# Patient Record
Sex: Male | Born: 1950
Health system: Southern US, Community
[De-identification: ages and names within clinical notes are randomized; demographics above are authoritative.]

## PROBLEM LIST (undated history)

## (undated) DIAGNOSIS — E782 Mixed hyperlipidemia: Secondary | ICD-10-CM

## (undated) DIAGNOSIS — D369 Benign neoplasm, unspecified site: Secondary | ICD-10-CM

## (undated) DIAGNOSIS — E039 Hypothyroidism, unspecified: Secondary | ICD-10-CM

## (undated) DIAGNOSIS — I10 Essential (primary) hypertension: Secondary | ICD-10-CM

## (undated) DIAGNOSIS — E119 Type 2 diabetes mellitus without complications: Secondary | ICD-10-CM

## (undated) HISTORY — DX: Essential (primary) hypertension: I10

## (undated) HISTORY — DX: Mixed hyperlipidemia: E78.2

## (undated) HISTORY — DX: Hypothyroidism, unspecified: E03.9

## (undated) HISTORY — DX: Type 2 diabetes mellitus without complications: E11.9

## (undated) HISTORY — PX: CHOLECYSTECTOMY: SHX55

## (undated) HISTORY — DX: Benign neoplasm, unspecified site: D36.9

## (undated) HISTORY — PX: VASECTOMY: SHX75

---

## 2009-10-04 DIAGNOSIS — D369 Benign neoplasm, unspecified site: Secondary | ICD-10-CM

## 2009-10-04 HISTORY — DX: Benign neoplasm, unspecified site: D36.9

## 2009-10-04 HISTORY — PX: COLONOSCOPY: SHX174

## 2013-07-08 DIAGNOSIS — H4011X0 Primary open-angle glaucoma, stage unspecified: Secondary | ICD-10-CM | POA: Diagnosis not present

## 2013-07-18 DIAGNOSIS — H4011X0 Primary open-angle glaucoma, stage unspecified: Secondary | ICD-10-CM | POA: Diagnosis not present

## 2013-10-05 ENCOUNTER — Emergency Department (HOSPITAL_COMMUNITY): Payer: Medicare Other

## 2013-10-05 ENCOUNTER — Encounter (HOSPITAL_COMMUNITY): Payer: Self-pay | Admitting: Emergency Medicine

## 2013-10-05 ENCOUNTER — Emergency Department (HOSPITAL_COMMUNITY)
Admission: EM | Admit: 2013-10-05 | Discharge: 2013-10-06 | Disposition: A | Discharge status: Home / Self Care | Payer: Medicare Other | Attending: Emergency Medicine | Admitting: Emergency Medicine

## 2013-10-05 DIAGNOSIS — J029 Acute pharyngitis, unspecified: Secondary | ICD-10-CM | POA: Insufficient documentation

## 2013-10-05 DIAGNOSIS — Z87891 Personal history of nicotine dependence: Secondary | ICD-10-CM | POA: Insufficient documentation

## 2013-10-05 DIAGNOSIS — J40 Bronchitis, not specified as acute or chronic: Secondary | ICD-10-CM | POA: Diagnosis not present

## 2013-10-05 DIAGNOSIS — J069 Acute upper respiratory infection, unspecified: Secondary | ICD-10-CM | POA: Insufficient documentation

## 2013-10-05 DIAGNOSIS — I4891 Unspecified atrial fibrillation: Secondary | ICD-10-CM | POA: Diagnosis not present

## 2013-10-05 DIAGNOSIS — E119 Type 2 diabetes mellitus without complications: Secondary | ICD-10-CM | POA: Diagnosis not present

## 2013-10-05 DIAGNOSIS — R5381 Other malaise: Secondary | ICD-10-CM | POA: Insufficient documentation

## 2013-10-05 DIAGNOSIS — R404 Transient alteration of awareness: Secondary | ICD-10-CM | POA: Diagnosis not present

## 2013-10-05 DIAGNOSIS — R5383 Other fatigue: Secondary | ICD-10-CM

## 2013-10-05 DIAGNOSIS — R110 Nausea: Secondary | ICD-10-CM | POA: Insufficient documentation

## 2013-10-05 DIAGNOSIS — R55 Syncope and collapse: Secondary | ICD-10-CM | POA: Diagnosis not present

## 2013-10-05 LAB — CBC WITH DIFFERENTIAL/PLATELET
Basophils Absolute: 0 10*3/uL (ref 0.0–0.1)
Basophils Relative: 1 % (ref 0–1)
EOS PCT: 3 % (ref 0–5)
Eosinophils Absolute: 0.1 10*3/uL (ref 0.0–0.7)
HCT: 39.8 % (ref 39.0–52.0)
HEMOGLOBIN: 14.4 g/dL (ref 13.0–17.0)
LYMPHS ABS: 1.4 10*3/uL (ref 0.7–4.0)
LYMPHS PCT: 33 % (ref 12–46)
MCH: 31.1 pg (ref 26.0–34.0)
MCHC: 36.2 g/dL — ABNORMAL HIGH (ref 30.0–36.0)
MCV: 86 fL (ref 78.0–100.0)
Monocytes Absolute: 0.4 10*3/uL (ref 0.1–1.0)
Monocytes Relative: 10 % (ref 3–12)
Neutro Abs: 2.4 10*3/uL (ref 1.7–7.7)
Neutrophils Relative %: 54 % (ref 43–77)
Platelets: 248 10*3/uL (ref 150–400)
RBC: 4.63 MIL/uL (ref 4.22–5.81)
RDW: 12.6 % (ref 11.5–15.5)
WBC: 4.4 10*3/uL (ref 4.0–10.5)

## 2013-10-05 LAB — BASIC METABOLIC PANEL WITH GFR
BUN: 16 mg/dL (ref 6–23)
CO2: 22 meq/L (ref 19–32)
Calcium: 8.7 mg/dL (ref 8.4–10.5)
Chloride: 96 meq/L (ref 96–112)
Creatinine, Ser: 0.76 mg/dL (ref 0.50–1.35)
GFR calc Af Amer: >90 mL/min
GFR calc non Af Amer: >90 mL/min
Glucose, Bld: 237 mg/dL — ABNORMAL HIGH (ref 70–99)
Potassium: 4 meq/L (ref 3.7–5.3)
Sodium: 132 meq/L — ABNORMAL LOW (ref 137–147)

## 2013-10-05 LAB — TROPONIN I: Troponin I: <0.3 ng/mL (ref <0.30)

## 2013-10-05 MED ORDER — RIVAROXABAN 20 MG PO TABS
20.0000 mg | ORAL_TABLET | Freq: Once | ORAL | Status: AC
  Administered 2013-10-05: 20 mg via ORAL
  Filled 2013-10-05: qty 1

## 2013-10-05 MED ORDER — RIVAROXABAN 20 MG PO TABS: 20.0000 mg | ORAL_TABLET | Freq: Every day | ORAL | Status: DC

## 2013-10-05 MED ORDER — RIVAROXABAN 10 MG PO TABS
ORAL_TABLET | ORAL | Status: AC
  Filled 2013-10-05: qty 2

## 2013-10-05 MED ORDER — SODIUM CHLORIDE 0.9 % IV BOLUS (SEPSIS)
1000.0000 mL | Freq: Once | INTRAVENOUS | Status: AC
  Administered 2013-10-05: 1000 mL via INTRAVENOUS

## 2013-10-05 NOTE — ED Notes (Signed)
Around 1830 tonight, pt states he got "real hot" and dizzy. Went to fridge and drank some orange juice and reports he felt a little better.

## 2013-10-05 NOTE — ED Notes (Signed)
Ambulated pt in hallway. O2 level stayed at 98%.

## 2013-10-05 NOTE — ED Provider Notes (Signed)
CSN: [id number]     Arrival date & time 10/05/13  2057 History  This chart was scribed for Sharyon Cable, MD by Jenne Campus, ED Scribe. This patient was seen in room APA18/APA18 and the patient's care was started at 9:26 PM.   Chief Complaint  Patient presents with  . Near Syncope     Patient is a 63 y.o. male presenting with near-syncope. The history is provided by the patient. No language interpreter was used.  Near Syncope This is a new problem. The current episode started less than 1 hour ago. The problem has been resolved. Pertinent negatives include no chest pain, no abdominal pain and no shortness of breath. He has tried rest for the symptoms. The treatment provided significant relief.    HPI Comments: Micheal Young is a 63 y.o. male who presents to the Emergency Department by ambulance from home complaining of one episode of near syncope that occurred about one hour ago and lasted for a few minutes. Pt states that he has been experiencing cough, congestion, sore throat, fatigue and nausea for the past week. He states that he has similar episodes this time of year every year which resolves after a zpak. He reports that he has taken a zpak with mild improvement and had felt that his sxs had improved upon waking this morning. He states that he got up tonight from resting all day and went to his kitchen where he drank one glass of OJ. He states that he went to his living room to lay back down when he felt hot and faint. He denies any room spinning or syncopal episodes. He reports one prior episode of similar symptoms resulting in a syncopal episode after a colonoscopy several years ago. He states that the episode years agio was attributed to dehydration and due to his nausea, he has not been eating or drinking at his baseline this past week. He denies SOB, CP, palpitations or abdominal pain as associated symptoms. He denies any h/o CHF, MI, A. Fib or CVA. He has a h/o DM and is on metformin  but states that he has not taken it since being on the zpak. Last A1C was 6.7. He is also treated for HLD and thyroid disease but denies missing any of those medications.    PCP is Dr. Blanch Media   Past Medical History  Diagnosis Date  . Diabetes mellitus without complication   . Thyroid disease   . Elevated cholesterol    Past Surgical History  Procedure Laterality Date  . Cholecystectomy    . Vasectomy     History reviewed. No pertinent family history. History  Substance Use Topics  . Smoking status: Former Research scientist (life sciences)  . Smokeless tobacco: Not on file  . Alcohol Use: No    Review of Systems  Constitutional: Positive for fatigue.  HENT: Positive for congestion (improved) and sore throat (improved).   Respiratory: Positive for cough. Negative for shortness of breath.   Cardiovascular: Positive for near-syncope. Negative for chest pain and palpitations.  Gastrointestinal: Positive for nausea. Negative for vomiting, abdominal pain and diarrhea.  All other systems reviewed and are negative.   Allergies  Shellfish allergy  Home Medications  No current outpatient prescriptions on file.  Triage Vitals: BP 152/89  Temp(Src) 97.7 F (36.5 C) (Oral)  Resp 20  Ht 5\' 8"  (1.727 m)  Wt 200 lb (90.719 kg)  BMI 30.42 kg/m2  SpO2 96%  Physical Exam  Nursing note and vitals reviewed.  CONSTITUTIONAL: Well  developed/well nourished HEAD: Normocephalic/atraumatic EYES: EOMI/PERRL ENMT: Mucous membranes dry NECK: supple no meningeal signs SPINE:entire spine nontender CV: tachycardic and irregular rhythm, no murmurs/rubs/gallops noted LUNGS: Lungs are clear to auscultation bilaterally, no apparent distress ABDOMEN: soft, nontender, no rebound or guarding GU:no cva tenderness NEURO: Pt is awake/alert, moves all extremitiesx4 EXTREMITIES: pulses normal, full ROM SKIN: warm, color normal PSYCH: no abnormalities of mood noted  ED Course  Procedures   Medications  sodium chloride  0.9 % bolus 1,000 mL (0 mLs Intravenous Stopped 10/05/13 2259)  Rivaroxaban (XARELTO) tablet 20 mg (20 mg Oral Given 10/05/13 2354)    DIAGNOSTIC STUDIES: Oxygen Saturation is 96% on RA, adequate by my interpretation.    COORDINATION OF CARE: 9:32 PM-Advised pt that his sxs appear to be from A. Fib. Discussed treatment plan which includes IV fluids, CXR, CBC panel, BMP and troponin with pt at bedside and pt agreed to plan.   10:44 PM- Pt rechecked and is resting comfortably. Blood work and CXR are not suggestive of anything concerning. Pt reports that he was able to do orthostatic vitals without difficulty. Admission was discussed, but pt would rather f/u as an outpatient. Advised that I would be comfortable with discharge.  Also advised pt that he will most likely be started on an anticoagulant. Will need to consult Cardiology first. He denies any h/o bleeding ulcers or prior hematochezia or melena. Pt's questions answered to apparent satisfaction and he is agreeable with this plan.  D/w dr Kennith Center with cardiology at [facility] He recommended starting xarelto 20mg  daily.  He will place call for patient to have f/u for outpatient management of afib.  Advised pt to ensure f/u by calling within 24-48 hours  He does not require rate control.  He reports his biggest concern is cough/congestion.  He denies cp/sob.  He does not feel weak while ambulating   I suspect pt had near syncopal episode likely due to recent cough/congestion/URI symptoms.   He did not want to come into hospital for workup and he appears reasonable to followup as outpatient Discussed strict return precautions.  Discussed need to continue xarelto and not stop abruptly.  We discussed bleeding risk as well   Labs Review Labs Reviewed  CBC WITH DIFFERENTIAL - Abnormal; Notable for the following:    MCHC 36.2 (*)    All other components within normal limits  BASIC METABOLIC PANEL - Abnormal; Notable for the following:    Sodium 132  (*)    Glucose, Bld 237 (*)    All other components within normal limits  TROPONIN I   Imaging Review Dg Chest Portable 1 View  10/05/2013   CLINICAL DATA:  Cough, congestion  EXAM: PORTABLE CHEST - 1 VIEW  COMPARISON:  Prior radiograph from 10/03/2009  FINDINGS: The cardiac and mediastinal silhouettes are stable in size and contour, and remain within normal limits.  The lungs are normally inflated. Mild diffuse bronchitic changes are present. No airspace consolidation, pleural effusion, or pulmonary edema is identified. There is no pneumothorax.  No acute osseous abnormality identified.  IMPRESSION: Mild diffuse bronchitic changes, suggesting bronchiolitis. No focal infiltrates identified.   Electronically Signed   By: Jeannine Boga M.D.   On: 10/05/2013 22:00     EKG Interpretation   Date/Time:  Sunday October 05 2013 20:48:51 EDT Ventricular Rate:  91 PR Interval:    QRS Duration: 94 QT Interval:  366 QTC Calculation: 450 R Axis:   15 Text Interpretation:  Atrial fibrillation Abnormal  ECG No previous ECGs  available Confirmed by Christy Gentles  MD, Kenndra Morris ([id number]) on 10/05/2013 9:33:54 PM      MDM   Final diagnoses:  Near syncope  Atrial fibrillation  URI (upper respiratory infection)    I personally performed the services described in this documentation, which was scribed in my presence. The recorded information has been reviewed and is accurate.        Sharyon Cable, MD 10/06/13 [id number]

## 2013-10-05 NOTE — ED Notes (Signed)
Patient ambulated in hall by nurse tech. Patient's heart rate up to 99 and pulse oximetry stayed above 98%. Patient denied dizziness and had no syncopal episodes, but patient reported "I just don't feel good."

## 2013-10-05 NOTE — Discharge Instructions (Signed)
Atrial Fibrillation Atrial fibrillation is a type of irregular heart rhythm (arrhythmia). During atrial fibrillation, the upper chambers of the heart (atria) quiver continuously in a chaotic pattern. This causes an irregular and often rapid heart rate.  Atrial fibrillation is the result of the heart becoming overloaded with disorganized signals that tell it to beat. These signals are normally released one at a time by a part of the right atrium called the sinoatrial node. They then travel from the atria to the lower chambers of the heart (ventricles), causing the atria and ventricles to contract and pump blood as they pass. In atrial fibrillation, parts of the atria outside of the sinoatrial node also release these signals. This results in two problems. First, the atria receive so many signals that they do not have time to fully contract. Second, the ventricles, which can only receive one signal at a time, beat irregularly and out of rhythm with the atria.  There are three types of atrial fibrillation:   Paroxysmal Paroxysmal atrial fibrillation starts suddenly and stops on its own within a week.   Persistent Persistent atrial fibrillation lasts for more than a week. It may stop on its own or with treatment.   Permanent Permanent atrial fibrillation does not go away. Episodes of atrial fibrillation may lead to permanent atrial fibrillation.  Atrial fibrillation can prevent your heart from pumping blood normally. It increases your risk of stroke and can lead to heart failure.  CAUSES   Heart conditions, including a heart attack, heart failure, coronary artery disease, and heart valve conditions.   Inflammation of the sac that surrounds the heart (pericarditis).   Blockage of an artery in the lungs (pulmonary embolism).   Pneumonia or other infections.   Chronic lung disease.   Thyroid problems, especially if the thyroid is overactive (hyperthyroidism).   Caffeine, excessive alcohol  use, and use of some illegal drugs.   Use of some medications, including certain decongestants and diet pills.   Heart surgery.   Birth defects.  Sometimes, no cause can be found. When this happens, the atrial fibrillation is called lone atrial fibrillation. The risk of complications from atrial fibrillation increases if you have lone atrial fibrillation and you are age 65 years or older. RISK FACTORS  Heart failure.  Coronary artery disease  Diabetes mellitus.   High blood pressure (hypertension).   Obesity.   Other arrhythmias.   Increased age. SYMPTOMS   A feeling that your heart is beating rapidly or irregularly.   A feeling of discomfort or pain in your chest.   Shortness of breath.   Sudden lightheadedness or weakness.   Getting tired easily when exercising.   Urinating more often than normal (mainly when atrial fibrillation first begins).  In paroxysmal atrial fibrillation, symptoms may start and suddenly stop. DIAGNOSIS  Your caregiver may be able to detect atrial fibrillation when taking your pulse. Usually, testing is needed to diagnosis atrial fibrillation. Tests may include:   Electrocardiography. During this test, the electrical impulses of your heart are recorded while you are lying down.   Echocardiography. During echocardiography, sound waves are used to evaluate how blood flows through your heart.   Stress test. There is more than one type of stress test. If a stress test is needed, ask your caregiver about which type is best for you.   Chest X-ray exam.   Blood tests.   Computed tomography (CT).  TREATMENT   Treating any underlying conditions. For example, if you have an overactive  thyroid, treating the condition may correct atrial fibrillation.   Medication. Medications may be given to control a rapid heart rate or to prevent blood clots, heart failure, or a stroke.   Procedure to correct the rhythm of the  heart:  Electrical cardioversion. During electrical cardioversion, a controlled, low-energy shock is delivered to the heart through your skin. If you have chest pain, very low pressure blood pressure, or sudden heart failure, this procedure may need to be done as an emergency.  Catheter ablation. During this procedure, heart tissues that send the signals that cause atrial fibrillation are destroyed.  Maze or minimaze procedure. During this surgery, thin lines of heart tissue that carry the abnormal signals are destroyed. The maze procedure is an open-heart surgery. The minimaze procedure is a minimally invasive surgery. This means that small cuts are made to access the heart instead of a large opening.  Pulmonary venous isolation. During this surgery, tissue around the veins that carry blood from the lungs (pulmonary veins) is destroyed. This tissue is thought to carry the abnormal signals. HOME CARE INSTRUCTIONS   Take medications as directed by your caregiver.  Only take medications that your caregiver approves. Some medications can make atrial fibrillation worse or recur.  If blood thinners were prescribed by your caregiver, take them exactly as directed. Too much can cause bleeding. Too little and you will not have the needed protection against stroke and other problems.  Perform blood tests at home if directed by your caregiver.  Perform blood tests exactly as directed.   Quit smoking if you smoke.   Do not drink alcohol.   Do not drink caffeinated beverages such as coffee, soda, and some teas. You may drink decaffeinated coffee, soda, or tea.   Maintain a healthy weight. Do not use diet pills unless your caregiver approves. They may make heart problems worse.   Follow diet instructions as directed by your caregiver.   Exercise regularly as directed by your caregiver.   Keep all follow-up appointments. PREVENTION  The following substances can cause atrial fibrillation  to recur:   Caffeinated beverages.   Alcohol.   Certain medications, especially those used for breathing problems.   Certain herbs and herbal medications, such as those containing ephedra or ginseng.  Illegal drugs such as cocaine and amphetamines. Sometimes medications are given to prevent atrial fibrillation from recurring. Proper treatment of any underlying condition is also important in helping prevent recurrence.  SEEK MEDICAL CARE IF:  You notice a change in the rate, rhythm, or strength of your heartbeat.   You suddenly begin urinating more frequently.   You tire more easily when exerting yourself or exercising.  SEEK IMMEDIATE MEDICAL CARE IF:   You develop chest pain, abdominal pain, sweating, or weakness.  You feel sick to your stomach (nauseous).  You develop shortness of breath.  You suddenly develop swollen feet and ankles.  You feel dizzy.  You face or limbs feel numb or weak.  There is a change in your vision or speech. MAKE SURE YOU:   Understand these instructions.  Will watch your condition.  Will get help right away if you are not doing well or get worse. Document Released: 06/19/2005 Document Revised: 10/14/2012 Document Reviewed: 07/30/2012 [facility] Patient Information 2014 [place].    Anticoagulation, Generic RISKS AND COMPLICATIONS  If you have received recent epidural anesthesia, spinal anesthesia, or a spinal tap while receiving anticoagulants, you are at risk for developing a blood clot in or around the  spine. This condition could result in long-term or permanent paralysis.  Because anticoagulants thin your blood, severe bleeding may occur from any tissue or organ. Symptoms of the blood being too thin may include:  Bleeding from the nose or gums that does not stop quickly.  Unusual bruising or bruising easily.  Swelling or pain at an injection site.  A cut that does not stop bleeding within 10 minutes.  Continual  nausea for more than 1 day or vomiting blood.  Coughing up blood.  Blood in the urine which may appear as pink, red, or brown urine.  Blood in bowel movements which may appear as red, dark or black stools.  Sudden weakness or numbness of the face, arm, or leg, especially on one side of the body.  Sudden confusion.  Trouble speaking (aphasia) or understanding.  Sudden trouble seeing in one or both eyes.  Sudden trouble walking.  Dizziness.  Loss of balance or coordination.  Severe pain, such as a headache, joint pain, or back pain.  Fever.  Too little anticoagulation continues to allow the risk for blood clots.   SEEK MEDICAL CARE IF:   You develop any rashes.  You have any worsening of the condition for which you are receiving anticoagulation therapy. SEEK IMMEDIATE MEDICAL CARE IF:   Bleeding from the nose or gums does not stop quickly.  You have unusual bruising or are bruising easily.  Swelling or pain occurs at an injection site.  A cut does not stop bleeding within 10 minutes.  You have continual nausea for more than 1 day or are vomiting blood.  You are coughing up blood.  You have blood in the urine.  You have dark or black stools.  You have sudden weakness or numbness of the face, arm, or leg, especially on one side of the body.  You have sudden confusion.  You have trouble speaking (aphasia) or understanding.  You have sudden trouble seeing in one or both eyes.  You have sudden trouble walking.  You have dizziness.  You have a loss of balance or coordination.  You have severe pain, such as a headache, joint pain, or back pain.  You have a serious fall or head injury, even if you are not bleeding.  You have an oral temperature above 102 F (38.9 C), not controlled by medicine. ANY OF THESE SYMPTOMS MAY REPRESENT A SERIOUS PROBLEM THAT IS AN EMERGENCY. Do not wait to see if the symptoms will go away. Get medical help right away. Call your  local emergency services (911 in [place] DO NOT drive yourself to the hospital. MAKE SURE YOU:   Understand these instructions.  Will watch your condition.  Will get help right away if you are not doing well or get worse. Document Released: 06/19/2005 Document Revised: 03/13/2012 Document Reviewed: 01/22/2008 [facility] Patient Information 2014 [place].

## 2013-10-06 LAB — CBG MONITORING, ED: GLUCOSE-CAPILLARY: 228 mg/dL — AB (ref 70–99)

## 2013-10-27 ENCOUNTER — Other Ambulatory Visit: Payer: Self-pay | Admitting: *Deleted

## 2013-10-27 ENCOUNTER — Encounter: Payer: Self-pay | Admitting: Cardiology

## 2013-10-27 ENCOUNTER — Ambulatory Visit (INDEPENDENT_AMBULATORY_CARE_PROVIDER_SITE_OTHER): Payer: Medicare Other | Admitting: Cardiology

## 2013-10-27 VITALS — BP 151/89 | HR 62 | Ht 68.0 in | Wt 196.0 lb

## 2013-10-27 DIAGNOSIS — I4891 Unspecified atrial fibrillation: Secondary | ICD-10-CM | POA: Diagnosis not present

## 2013-10-27 DIAGNOSIS — R55 Syncope and collapse: Secondary | ICD-10-CM | POA: Diagnosis not present

## 2013-10-27 DIAGNOSIS — E782 Mixed hyperlipidemia: Secondary | ICD-10-CM | POA: Diagnosis not present

## 2013-10-27 DIAGNOSIS — E119 Type 2 diabetes mellitus without complications: Secondary | ICD-10-CM | POA: Diagnosis not present

## 2013-10-27 NOTE — Patient Instructions (Signed)
Your physician recommends that you schedule a follow-up appointment in: 1 month. Your physician recommends that you continue on your current medications as directed. Please refer to the Current Medication list given to you today. Your physician has recommended that you wear an event monitor. Event monitors are medical devices that record the heart's electrical activity. Doctors most often [place] these monitors to diagnose arrhythmias. Arrhythmias are problems with the speed or rhythm of the heartbeat. The monitor is a small, portable device. You can wear one while you do your normal daily activities. This is usually used to diagnose what is causing palpitations/syncope (passing out). [facility] WILL CONTACT YOU DIRECTLY ABOUT GETTING THIS MONITOR. Your physician has requested that you have an echocardiogram. Echocardiography is a painless test that uses sound waves to create images of your heart. It provides your doctor with information about the size and shape of your heart and how well your heart's chambers and valves are working. This procedure takes approximately one hour. There are no restrictions for this procedure.

## 2013-10-27 NOTE — Assessment & Plan Note (Signed)
Recently diagnosed in early April, duration uncertain and spontaneously converted to sinus rhythm at some point since then. CHADSVASC score is 3. It is not clear whether he has had PAF, or if this was an episode of atrial fibrillation related to upper respiratory tract infection. ECG today shows normal sinus rhythm. For now our plan is to continue current regimen including Toprol-XL and Xarelto. We will have him wear a monitor for 7 days and obtain echocardiogram. Followup arranged to review the results and discuss whether to continue anticoagulants long-term for the possibility of PAF, versus go back to aspirin daily and then consider anticoagulation with his next confirmed event of atrial fibrillation.

## 2013-10-27 NOTE — Assessment & Plan Note (Signed)
Followed by Dr. Sherrie Sport.

## 2013-10-27 NOTE — Assessment & Plan Note (Signed)
On Lipitor, followed by Dr. Sherrie Sport.

## 2013-10-27 NOTE — Assessment & Plan Note (Signed)
Possibly related to relative dehydration with URI. Continue observation for now. We are obtaining an echocardiogram as well to assess cardiac structure and function.

## 2013-10-27 NOTE — Progress Notes (Signed)
Clinical Summary Micheal Young is a 63 y.o.male referred for cardiology consultation by Dr. Sherrie Sport after recent ER visit at [facility]. The patient presented earlier in the month with symptoms of dizziness and weakness. This had been preceded by a week of upper respiratory symptoms, possible URI treated with azithromycin. He had not been eating or drinking well. When he was in the ER, he was hydrated and felt better, although diagnosed with rate-controlled atrial fibrillation of uncertain duration. He was placed on Xarelto at that time and has had followup with Dr. Sherrie Sport since then.  Lab work from this April reviewed finding hemoglobin 14.4, platelets 248, potassium 4.0, BUN 16, creatinine 0.7, troponin I less than 0.30. ECG from April 5 showed atrial fibrillation with controlled ventricular response, nonspecific T-wave changes. Chest x-ray from April 5 described mild diffuse bronchitic changes, no infiltrates.  CHADSVASC score is 3 is this point. He is not aware of any symptoms of palpitations, no prior diagnosis of atrial fibrillation. Today we discussed overall stroke risk versus bleeding risk with anticoagulants versus aspirin. Annual stroke risk would be approximately 3-4% on aspirin with a bleeding risk of 1% per year versus stroke risk of 1-2% on Xarelto with a major bleeding risk of 2-3% per year.  ECG today shows normal sinus rhythm.   Allergies  Allergen Reactions  . Shellfish Allergy Swelling    Swelling of the eyes    Current Outpatient Prescriptions  Medication Sig Dispense Refill  . atorvastatin (LIPITOR) 40 MG tablet Take 40 mg by mouth at bedtime.      Marland Kitchen LUMIGAN 0.01 % SOLN Place 1 drop into both eyes at bedtime.      . metFORMIN (GLUCOPHAGE-XR) 500 MG 24 hr tablet Take 500 mg by mouth daily.      . metoprolol succinate (TOPROL-XL) 25 MG 24 hr tablet Take 1 tablet by mouth daily.      . Rivaroxaban (XARELTO) 20 MG TABS tablet Take 1 tablet (20 mg total) by mouth daily  with supper.  30 tablet  0  . SIMBRINZA 1-0.2 % SUSP Place 1 drop into the left eye 2 (two) times daily.      Marland Kitchen SYNTHROID 150 MCG tablet Take 150 mcg by mouth every morning.      . timolol (TIMOPTIC) 0.5 % ophthalmic solution Place 1 drop into both eyes every morning.       No current facility-administered medications for this visit.    Past Medical History  Diagnosis Date  . Type 2 diabetes mellitus   . Hypothyroidism   . Mixed hyperlipidemia   . Essential hypertension, benign     Past Surgical History  Procedure Laterality Date  . Cholecystectomy    . Vasectomy      History reviewed. No pertinent family history.  Social History Micheal Young reports that he quit smoking about 27 years ago. His smoking use included Cigarettes. He started smoking about 45 years ago. He has a 17 pack-year smoking history. He has never used smokeless tobacco. Micheal Young reports that he does not drink alcohol.  Review of Systems No exertional chest pain, NYHA class II dyspnea. No orthopnea or PND. No unusual bleeding problems. Otherwise negative.  Physical Examination Filed Vitals:   10/27/13 0830  BP: 151/89  Pulse: 62   Filed Weights   10/27/13 0830  Weight: 196 lb (88.905 kg)   Patient appears comfortable at rest. HEENT: Conjunctiva and lids normal, oropharynx clear. Neck: Supple, no elevated JVP or carotid  bruits, no thyromegaly. Lungs: Clear to auscultation, nonlabored breathing at rest. Cardiac: Regular rate and rhythm, no S3 or significant systolic murmur, no pericardial rub. Abdomen: Soft, nontender, bowel sounds present. Extremities: No pitting edema, distal pulses 2+. Skin: Warm and dry. Musculoskeletal: No kyphosis. Neuropsychiatric: Alert and oriented x3, affect grossly appropriate.   Problem List and Plan   Atrial fibrillation Recently diagnosed in early April, duration uncertain and spontaneously converted to sinus rhythm at some point since then. CHADSVASC score is  3. It is not clear whether he has had PAF, or if this was an episode of atrial fibrillation related to upper respiratory tract infection. ECG today shows normal sinus rhythm. For now our plan is to continue current regimen including Toprol-XL and Xarelto. We will have him wear a monitor for 7 days and obtain echocardiogram. Followup arranged to review the results and discuss whether to continue anticoagulants long-term for the possibility of PAF, versus go back to aspirin daily and then consider anticoagulation with his next confirmed event of atrial fibrillation.  Mixed hyperlipidemia On Lipitor, followed by Dr. Sherrie Sport.  Type 2 diabetes mellitus Followed by Dr. Sherrie Sport.  Near syncope Possibly related to relative dehydration with URI. Continue observation for now. We are obtaining an echocardiogram as well to assess cardiac structure and function.    Satira Sark, M.D., F.A.C.C.

## 2013-11-06 ENCOUNTER — Other Ambulatory Visit: Payer: Self-pay

## 2013-11-06 ENCOUNTER — Other Ambulatory Visit (INDEPENDENT_AMBULATORY_CARE_PROVIDER_SITE_OTHER): Payer: Medicare Other

## 2013-11-06 DIAGNOSIS — I4891 Unspecified atrial fibrillation: Secondary | ICD-10-CM

## 2013-11-06 DIAGNOSIS — I059 Rheumatic mitral valve disease, unspecified: Secondary | ICD-10-CM | POA: Diagnosis not present

## 2013-11-07 DIAGNOSIS — I4891 Unspecified atrial fibrillation: Secondary | ICD-10-CM

## 2013-11-10 ENCOUNTER — Telehealth: Payer: Self-pay | Admitting: *Deleted

## 2013-11-10 NOTE — Telephone Encounter (Signed)
Notes Recorded by Laurine Blazer, LPN on 4/78/2956 at 2:13 PM Patient notified and verbalized understanding. Already has follow up scheduled with Dr. Domenic Polite for 12/01/2013.

## 2013-11-10 NOTE — Telephone Encounter (Signed)
Message copied by Laurine Blazer on Mon Nov 10, 2013  1:52 PM ------      Message from: Satira Sark      Created: Fri Nov 07, 2013  7:46 AM       Reviewed report. Moderate LVH in the setting of hypertension with normal systolic function. No major valvular abnormalities of clinical concern. Will discuss further office followup. ------

## 2013-11-20 DIAGNOSIS — H4011X0 Primary open-angle glaucoma, stage unspecified: Secondary | ICD-10-CM | POA: Diagnosis not present

## 2013-12-01 ENCOUNTER — Encounter: Payer: Self-pay | Admitting: Cardiology

## 2013-12-01 ENCOUNTER — Ambulatory Visit (INDEPENDENT_AMBULATORY_CARE_PROVIDER_SITE_OTHER): Payer: Medicare Other | Admitting: Cardiology

## 2013-12-01 VITALS — BP 156/79 | HR 73 | Ht 68.0 in | Wt 200.8 lb

## 2013-12-01 DIAGNOSIS — I4891 Unspecified atrial fibrillation: Secondary | ICD-10-CM | POA: Diagnosis not present

## 2013-12-01 DIAGNOSIS — E782 Mixed hyperlipidemia: Secondary | ICD-10-CM | POA: Diagnosis not present

## 2013-12-01 MED ORDER — ASPIRIN EC 81 MG PO TBEC: 81.0000 mg | DELAYED_RELEASE_TABLET | Freq: Every day | ORAL | Status: AC

## 2013-12-01 NOTE — Assessment & Plan Note (Signed)
Single documented episode in the setting of a URI, spontaneously converted to sinus rhythm. In light of relatively low stroke risk, no documented asymptomatic PAF on subsequent monitoring, and reassuring echocardiographic results, plan will be to discontinue Xarelto for now (patient's preference) and go on aspirin daily 81 mg. If he has any further atrial arrhythmias however, anticoagulation would be recommended longer-term.

## 2013-12-01 NOTE — Patient Instructions (Signed)
Your physician recommends that you schedule a follow-up appointment in: 6 months. You will receive a reminder letter in the mail in about 4 months reminding you to call and schedule your appointment. If you don't receive this letter, please contact our office. Your physician has recommended you make the following change in your medication:  Start aspirin 81 mg daily. Continue all other medications the same.

## 2013-12-01 NOTE — Assessment & Plan Note (Signed)
He is concerned about possible Lipitor side effects. He does plan to address this with Dr. Sherrie Sport. I recommended that he consider a drug holiday to see if symptoms resolve.

## 2013-12-01 NOTE — Progress Notes (Signed)
Clinical Summary Micheal Young is a 63 y.o.male last seen in April. He was seen in that time in followup of documented atrial fibrillation of uncertain duration noted in the setting of URI. He had spontaneously converted to sinus rhythm by the time that we saw him.  Echocardiogram from May showed moderate LVH with LVEF 72-53%, grade 1 diastolic dysfunction, mildly sclerotic aortic valve, mild mitral regurgitation, mildly dilated RV. Followup cardiac monitor did not demonstrate any atrial fibrillation.  CHADSVASC score is 3. He is not aware of any symptoms of palpitations, no prior diagnosis of atrial fibrillation. We discussed overall stroke risk versus bleeding risk with anticoagulants versus aspirin. Annual stroke risk would be approximately 3-4% on aspirin with a bleeding risk of 1% per year versus stroke risk of 1-2% on Xarelto with a major bleeding risk of 2-3% per year.  He is here with his wife today. Still not experiencing any palpitations, chest pain, or shortness of breath. Rhythm remains regular. He would like to come off anticoagulation unless he has any further documented atrial fibrillation, not unreasonable with relatively low stroke risk. I have recommended that he take an aspirin daily.  He reports feeling tired, also having joint/shoulder discomfort since being on atorvastatin. He plans to address this with Dr. Sherrie Sport.   Allergies  Allergen Reactions  . Shellfish Allergy Swelling    Swelling of the eyes    Current Outpatient Prescriptions  Medication Sig Dispense Refill  . atorvastatin (LIPITOR) 40 MG tablet Take 40 mg by mouth at bedtime.      Marland Kitchen LUMIGAN 0.01 % SOLN Place 1 drop into both eyes at bedtime.      . metFORMIN (GLUCOPHAGE-XR) 500 MG 24 hr tablet Take 500 mg by mouth daily.      . metoprolol succinate (TOPROL-XL) 25 MG 24 hr tablet Take 1 tablet by mouth daily.      . Rivaroxaban (XARELTO) 20 MG TABS tablet Take 1 tablet (20 mg total) by mouth daily with  supper.  30 tablet  0  . SIMBRINZA 1-0.2 % SUSP Place 1 drop into the left eye 2 (two) times daily.      Marland Kitchen SYNTHROID 150 MCG tablet Take 150 mcg by mouth every morning.      . timolol (TIMOPTIC) 0.5 % ophthalmic solution Place 1 drop into both eyes every morning.       No current facility-administered medications for this visit.    Past Medical History  Diagnosis Date  . Type 2 diabetes mellitus   . Hypothyroidism   . Mixed hyperlipidemia   . Essential hypertension, benign     Social History Mr. Salls reports that he quit smoking about 27 years ago. His smoking use included Cigarettes. He started smoking about 45 years ago. He has a 17 pack-year smoking history. He has never used smokeless tobacco. Mr. Moring reports that he does not drink alcohol.  Review of Systems Negative except as outlined.  Physical Examination Filed Vitals:   12/01/13 1535  BP: 156/79  Pulse: 73   Filed Weights   12/01/13 1535  Weight: 200 lb 12.8 oz (91.082 kg)    Patient appears comfortable at rest.  HEENT: Conjunctiva and lids normal, oropharynx clear.  Neck: Supple, no elevated JVP or carotid bruits, no thyromegaly.  Lungs: Clear to auscultation, nonlabored breathing at rest.  Cardiac: Regular rate and rhythm, no S3 or significant systolic murmur, no pericardial rub.  Abdomen: Soft, nontender, bowel sounds present.  Extremities: No pitting edema,  distal pulses 2+.    Problem List and Plan   Atrial fibrillation Single documented episode in the setting of a URI, spontaneously converted to sinus rhythm. In light of relatively low stroke risk, no documented asymptomatic PAF on subsequent monitoring, and reassuring echocardiographic results, plan will be to discontinue Xarelto for now (patient's preference) and go on aspirin daily 81 mg. If he has any further atrial arrhythmias however, anticoagulation would be recommended longer-term.  Mixed hyperlipidemia He is concerned about possible  Lipitor side effects. He does plan to address this with Dr. Sherrie Sport. I recommended that he consider a drug holiday to see if symptoms resolve.    Satira Sark, M.D., F.A.C.C.

## 2013-12-04 DIAGNOSIS — E038 Other specified hypothyroidism: Secondary | ICD-10-CM | POA: Diagnosis not present

## 2013-12-04 DIAGNOSIS — IMO0001 Reserved for inherently not codable concepts without codable children: Secondary | ICD-10-CM | POA: Diagnosis not present

## 2013-12-10 DIAGNOSIS — Z125 Encounter for screening for malignant neoplasm of prostate: Secondary | ICD-10-CM | POA: Diagnosis not present

## 2013-12-10 DIAGNOSIS — IMO0001 Reserved for inherently not codable concepts without codable children: Secondary | ICD-10-CM | POA: Diagnosis not present

## 2013-12-10 DIAGNOSIS — E038 Other specified hypothyroidism: Secondary | ICD-10-CM | POA: Diagnosis not present

## 2013-12-10 DIAGNOSIS — I10 Essential (primary) hypertension: Secondary | ICD-10-CM | POA: Diagnosis not present

## 2013-12-10 DIAGNOSIS — E782 Mixed hyperlipidemia: Secondary | ICD-10-CM | POA: Diagnosis not present

## 2014-01-05 DIAGNOSIS — G5600 Carpal tunnel syndrome, unspecified upper limb: Secondary | ICD-10-CM | POA: Diagnosis not present

## 2014-01-14 ENCOUNTER — Ambulatory Visit (INDEPENDENT_AMBULATORY_CARE_PROVIDER_SITE_OTHER): Payer: Medicare Other

## 2014-01-14 ENCOUNTER — Telehealth: Payer: Self-pay | Admitting: *Deleted

## 2014-01-14 DIAGNOSIS — R209 Unspecified disturbances of skin sensation: Secondary | ICD-10-CM | POA: Diagnosis not present

## 2014-01-14 NOTE — Procedures (Signed)
     HISTORY:  Jarick Harkins is a 63 year old patient with a history of numbness involving mainly the left hand. The patient is being evaluated for possible neuropathy. The patient does have a history of diabetes and hypothyroidism.  NERVE CONDUCTION STUDIES:  Nerve conduction studies were performed on both upper extremities. The distal motor latencies and motor amplitudes for the median and ulnar nerves were within normal limits. The distal motor latencies for the radial nerves were slightly prolonged bilaterally. The F wave latencies and nerve conduction velocities for the median and ulnar nerves were also normal. The sensory latencies for the median, radial, and ulnar nerves were normal.   EMG STUDIES:  EMG evaluation was not performed.  IMPRESSION:  Nerve conduction studies done on both upper extremities were essentially normal with exception that the distal motor latencies for the radial nerves were slightly prolonged bilaterally, suggesting mild distal dysfunction of the radial nerves between the elbow and wrist, with sensory sparing. This could represent mild bilateral posterior interosseous neuropathies. Clinical correlation is required. No other significant abnormalities were seen. EMG evaluation may improve diagnostic specificity.  Jill Alexanders MD 01/14/2014 1:59 PM  [facility] [address] [place], [place] [address]  Phone [phone] Fax [phone]

## 2014-01-22 ENCOUNTER — Ambulatory Visit: Payer: Medicare Other | Admitting: Gastroenterology

## 2014-01-22 ENCOUNTER — Telehealth: Payer: Self-pay | Admitting: Internal Medicine

## 2014-01-22 NOTE — Telephone Encounter (Signed)
-----   Message -----  From: Theadora Rama  Sent: 01/22/2014 8:10 AM  To: Mickel Fuchs, MD   I called patient yesterday to offer him OV for today or next Tuesday, but he said it was too short of notice and he is going out of town Sunday for 2 weeks. I have him scheduled for 8/21 with RMR. Patient does not want to see extender.

## 2014-02-20 ENCOUNTER — Ambulatory Visit (INDEPENDENT_AMBULATORY_CARE_PROVIDER_SITE_OTHER): Payer: Medicare Other | Admitting: Internal Medicine

## 2014-02-20 ENCOUNTER — Other Ambulatory Visit: Payer: Self-pay | Admitting: Internal Medicine

## 2014-02-20 ENCOUNTER — Encounter (INDEPENDENT_AMBULATORY_CARE_PROVIDER_SITE_OTHER): Payer: Self-pay

## 2014-02-20 ENCOUNTER — Encounter: Payer: Self-pay | Admitting: Internal Medicine

## 2014-02-20 VITALS — BP 157/86 | HR 71 | Temp 97.8°F | Ht 68.0 in | Wt 199.8 lb

## 2014-02-20 DIAGNOSIS — Z800 Family history of malignant neoplasm of digestive organs: Secondary | ICD-10-CM

## 2014-02-20 DIAGNOSIS — Z86010 Personal history of colonic polyps: Secondary | ICD-10-CM

## 2014-02-20 NOTE — Patient Instructions (Signed)
Schedule colonoscopy - positive family history of colon cancer and personal history of colon polyps  Review adequate hydration during preparation period  Split Movie prep  Take 1/2 of your metformin the day before colonoscopy and none day of procedure  Further recommendations to follow

## 2014-02-20 NOTE — Progress Notes (Signed)
Primary Care Physician:  Neale Burly, MD Primary Gastroenterologist:  Dr. Gala Romney  Pre-Procedure History & Physical: HPI:  Micheal Young is a 63 y.o. male here for for consideration of a followup colonoscopy. Patient's brother, whom I caree for her over the years, succumbed to colorectal cancer last year. He was diagnosed in his early 3s. Mr. Ryser  last colonoscopy up in [facility]  April 2011. At that time a small rectal adenoma was found. Currently, not having any lower GI tract symptoms or upper GI tract symptoms for that matter.  Patient was concerned about getting "dehydrated" in preparing for his last 2 colonoscopies. Apparently, just to took the prep and not much in the way a maintenance fluids during the prep.  Past Medical History  Diagnosis Date  . Type 2 diabetes mellitus   . Hypothyroidism   . Mixed hyperlipidemia   . Essential hypertension, benign   . Tubular adenoma 10/04/09    Dr. Rowe Pavy    Past Surgical History  Procedure Laterality Date  . Cholecystectomy    . Vasectomy    . Colonoscopy  10/04/09    Dr.Anwara sessile polyp was found in the rectum, no other abnormalities. bx= tubular adenoma    Prior to Admission medications   Medication Sig Start Date End Date Taking? Authorizing Provider  aspirin EC 81 MG tablet Take 1 tablet (81 mg total) by mouth daily. 12/01/13  Yes Satira Sark, MD  atorvastatin (LIPITOR) 40 MG tablet Take 40 mg by mouth at bedtime. 09/29/13  Yes Historical Provider, MD  LUMIGAN 0.01 % SOLN Place 1 drop into both eyes at bedtime. 09/16/13  Yes Historical Provider, MD  metFORMIN (GLUCOPHAGE-XR) 500 MG 24 hr tablet Take 500 mg by mouth daily. 07/09/13  Yes Historical Provider, MD  metoprolol succinate (TOPROL-XL) 25 MG 24 hr tablet Take 1 tablet by mouth daily. 10/06/13  Yes Historical Provider, MD  SIMBRINZA 1-0.2 % SUSP Place 1 drop into the left eye 2 (two) times daily. 09/09/13  Yes Historical Provider, MD  SYNTHROID 150 MCG tablet Take 150 mcg by  mouth every morning. 09/09/13  Yes Historical Provider, MD  timolol (TIMOPTIC) 0.5 % ophthalmic solution Place 1 drop into both eyes every morning. 09/09/13  Yes Historical Provider, MD    Allergies as of 02/20/2014 - Review Complete 12/01/2013  Allergen Reaction Noted  . Shellfish allergy Swelling 10/05/2013    No family history on file.  History   Social History  . Marital Status: Married    Spouse Name: N/A    Number of Children: N/A  . Years of Education: N/A   Occupational History  . Not on file.   Social History Main Topics  . Smoking status: Former Smoker -- 1.00 packs/day for 17 years    Types: Cigarettes    Start date: 07/10/1968    Quit date: 12/19/1985  . Smokeless tobacco: Never Used     Comment: smoked pipe also  . Alcohol Use: No  . Drug Use: No  . Sexual Activity: Not on file   Other Topics Concern  . Not on file   Social History Narrative  . No narrative on file    Review of Systems: See HPI, otherwise negative ROS  Physical Exam: BP 157/86  Pulse 71  Temp(Src) 97.8 F (36.6 C) (Oral)  Ht 5\' 8"  (1.727 m)  Wt 199 lb 12.8 oz (90.629 kg)  BMI 30.39 kg/m2 General:   Alert,  Well-developed, well-nourished, pleasant and cooperative in NAD  Skin:  Intact without significant lesions or rashes. Eyes:  Sclera clear, no icterus.   Conjunctiva pink. Ears:  Normal auditory acuity. Nose:  No deformity, discharge,  or lesions. Mouth:  No deformity or lesions. Neck:  Supple; no masses or thyromegaly. No significant cervical adenopathy. Lungs:  Clear throughout to auscultation.   No wheezes, crackles, or rhonchi. No acute distress. Heart:  Regular rate and rhythm; no murmurs, clicks, rubs,  or gallops. Abdomen: Non-distended, normal bowel sounds.  Soft and nontender without appreciable mass or hepatosplenomegaly.  Pulses:  Normal pulses noted. Extremities:  Without clubbing or edema.  Impression:   Very pleasant 63 year old gentleman personal history of  colonic adenoma and positive family history of colon cancer in first-degree relative at a relatively young age. Currently, no GI symptoms. It's been 3 1/2 years since he last had a colonoscopy. He desires a colonoscopy at this time. I agree with that approach.  Recommendations:   Reviewed the importance of maintenance fluids in addition to the colonoscopy fluid preparation during the preparation period.  The risks, benefits, limitations, alternatives and imponderables have been reviewed with the patient. Questions have been answered. All parties are agreeable.   Schedule colonoscopy - positive family history of colon cancer and personal history of colon polyps  Review adequate hydration during preparation period  Split Movie prep  Take 1/2 of your metformin the day before colonoscopy and none day of procedure  Further recommendations to follow      Notice: This dictation was prepared with Dragon dictation along with smaller phrase technology. Any transcriptional errors that result from this process are unintentional and may not be corrected upon review.

## 2014-02-22 ENCOUNTER — Encounter: Payer: Self-pay | Admitting: Internal Medicine

## 2014-02-23 ENCOUNTER — Encounter (HOSPITAL_COMMUNITY): Payer: Self-pay | Admitting: Pharmacy Technician

## 2014-03-03 DIAGNOSIS — H4011X0 Primary open-angle glaucoma, stage unspecified: Secondary | ICD-10-CM | POA: Diagnosis not present

## 2014-03-10 ENCOUNTER — Encounter (HOSPITAL_COMMUNITY): Payer: Self-pay | Admitting: *Deleted

## 2014-03-10 ENCOUNTER — Encounter (HOSPITAL_COMMUNITY)
Admission: RE | Disposition: A | Discharge status: Home / Self Care | Payer: Self-pay | Source: Ambulatory Visit | Attending: Internal Medicine

## 2014-03-10 ENCOUNTER — Ambulatory Visit (HOSPITAL_COMMUNITY)
Admission: RE | Admit: 2014-03-10 | Discharge: 2014-03-10 | Disposition: A | Discharge status: Home / Self Care | Payer: Medicare Other | Source: Ambulatory Visit | Attending: Internal Medicine | Admitting: Internal Medicine

## 2014-03-10 DIAGNOSIS — Z860100 Personal history of colon polyps, unspecified: Secondary | ICD-10-CM | POA: Diagnosis not present

## 2014-03-10 DIAGNOSIS — E039 Hypothyroidism, unspecified: Secondary | ICD-10-CM | POA: Diagnosis not present

## 2014-03-10 DIAGNOSIS — E119 Type 2 diabetes mellitus without complications: Secondary | ICD-10-CM | POA: Insufficient documentation

## 2014-03-10 DIAGNOSIS — D128 Benign neoplasm of rectum: Secondary | ICD-10-CM | POA: Diagnosis not present

## 2014-03-10 DIAGNOSIS — D126 Benign neoplasm of colon, unspecified: Secondary | ICD-10-CM | POA: Diagnosis not present

## 2014-03-10 DIAGNOSIS — D129 Benign neoplasm of anus and anal canal: Secondary | ICD-10-CM | POA: Diagnosis not present

## 2014-03-10 DIAGNOSIS — Z87891 Personal history of nicotine dependence: Secondary | ICD-10-CM | POA: Insufficient documentation

## 2014-03-10 DIAGNOSIS — Z1211 Encounter for screening for malignant neoplasm of colon: Secondary | ICD-10-CM | POA: Insufficient documentation

## 2014-03-10 DIAGNOSIS — E782 Mixed hyperlipidemia: Secondary | ICD-10-CM | POA: Insufficient documentation

## 2014-03-10 DIAGNOSIS — Z800 Family history of malignant neoplasm of digestive organs: Secondary | ICD-10-CM | POA: Diagnosis not present

## 2014-03-10 DIAGNOSIS — I10 Essential (primary) hypertension: Secondary | ICD-10-CM | POA: Diagnosis not present

## 2014-03-10 HISTORY — PX: COLONOSCOPY: SHX5424

## 2014-03-10 LAB — GLUCOSE, CAPILLARY: Glucose-Capillary: 164 mg/dL — ABNORMAL HIGH (ref 70–99)

## 2014-03-10 SURGERY — COLONOSCOPY
Anesthesia: Moderate Sedation

## 2014-03-10 MED ORDER — MIDAZOLAM HCL 5 MG/5ML IJ SOLN
INTRAMUSCULAR | Status: DC | PRN
  Administered 2014-03-10: 1 mg via INTRAVENOUS
  Administered 2014-03-10: 2 mg via INTRAVENOUS
  Administered 2014-03-10: 1 mg via INTRAVENOUS
  Administered 2014-03-10: 2 mg via INTRAVENOUS
  Administered 2014-03-10 (×2): 1 mg via INTRAVENOUS

## 2014-03-10 MED ORDER — ONDANSETRON HCL 4 MG/2ML IJ SOLN
INTRAMUSCULAR | Status: DC
Start: 2014-03-10 — End: 2014-03-10
  Filled 2014-03-10: qty 2

## 2014-03-10 MED ORDER — MEPERIDINE HCL 100 MG/ML IJ SOLN
INTRAMUSCULAR | Status: AC
  Filled 2014-03-10: qty 2

## 2014-03-10 MED ORDER — MIDAZOLAM HCL 5 MG/5ML IJ SOLN
INTRAMUSCULAR | Status: DC
Start: 2014-03-10 — End: 2014-03-10
  Filled 2014-03-10: qty 10

## 2014-03-10 MED ORDER — MEPERIDINE HCL 100 MG/ML IJ SOLN
INTRAMUSCULAR | Status: DC | PRN
  Administered 2014-03-10 (×2): 25 mg via INTRAVENOUS
  Administered 2014-03-10: 50 mg via INTRAVENOUS

## 2014-03-10 MED ORDER — SODIUM CHLORIDE 0.9 % IV SOLN
INTRAVENOUS | Status: DC
  Administered 2014-03-10: 12:00:00 via INTRAVENOUS

## 2014-03-10 MED ORDER — SIMETHICONE 40 MG/0.6ML PO SUSP
ORAL | Status: DC | PRN
  Administered 2014-03-10: 12:00:00

## 2014-03-10 MED ORDER — ONDANSETRON HCL 4 MG/2ML IJ SOLN
INTRAMUSCULAR | Status: DC | PRN
  Administered 2014-03-10: 4 mg via INTRAVENOUS

## 2014-03-10 NOTE — Interval H&P Note (Signed)
History and Physical Interval Note:  03/10/2014 12:04 PM  Micheal Young  has presented today for surgery, with the diagnosis of HISTORY OF COLON POLYPS  The various methods of treatment have been discussed with the patient and family. After consideration of risks, benefits and other options for treatment, the patient has consented to  Procedure(s) with comments: COLONOSCOPY (N/A) - 12:45-rescheduled 9/8 to [place] notified pt as a surgical intervention .  The patient's history has been reviewed, patient examined, no change in status, stable for surgery.  I have reviewed the patient's chart and labs.  Questions were answered to the patient's satisfaction.     No change. Surveillance colonoscopy per plan.The risks, benefits, limitations, alternatives and imponderables have been reviewed with the patient. Questions have been answered. All parties are agreeable.   Manus Rudd

## 2014-03-10 NOTE — Op Note (Signed)
[facility] [address] [place], [address]   COLONOSCOPY PROCEDURE REPORT  PATIENT: Micheal Young, Micheal Young  MR#:         [id number] BIRTHDATE: 1950-12-17 , 89  yrs. old GENDER: Male ENDOSCOPIST: R.  Garfield Cornea, MD FACP FACG REFERRED BY:  Nadeen Landau, M.D. PROCEDURE DATE:  03/10/2014 PROCEDURE:     Ileocolonoscopy with snare polypectomy and biopsy  INDICATIONS: Surveillance examination; personal history of colonic polyp and positive family history of colorectal cancer  INFORMED CONSENT:  The risks, benefits, alternatives and imponderables including but not limited to bleeding, perforation as well as the possibility of a missed lesion have been reviewed.  The potential for biopsy, lesion removal, etc. have also been discussed.  Questions have been answered.  All parties agreeable. Please see the history and physical in the medical record for more information.  MEDICATIONS: Versed 8 mg IV and Demerol 100 mg IV in divided doses. Zofran 4 mg IV  DESCRIPTION OF PROCEDURE:  After a digital rectal exam was performed, the EC-3890Li ([id number]) and EC-3890Li ([id number]) colonoscope was advanced from the anus through the rectum and colon to the area of the cecum, ileocecal valve and appendiceal orifice. The cecum was deeply intubated.  These structures were well-seen and photographed for the record.  From the level of the cecum and ileocecal valve, the scope was slowly and cautiously withdrawn. The mucosal surfaces were carefully surveyed utilizing scope tip deflection to facilitate fold flattening as needed.  The scope was pulled down into the rectum where a thorough examination including retroflexion was performed.    FINDINGS:  Adequate preparation. Normal rectum.  At the rectosigmoid junction, there were (2) 5 mm polyps; there was (1) diminutive polyp in the distal transverse colon. The remainder of the colonic mucosa appeared normal. The distal 5 cm of terminal ileum  mucosa also appeared normal.  THERAPEUTIC / DIAGNOSTIC MANEUVERS PERFORMED:  The rectosigmoid polyps were hot snare removed. The diminutive transverse colon polyp cold biopsied/removed.  COMPLICATIONS: none  CECAL WITHDRAWAL TIME:  15 minutes  IMPRESSION:  Colonic polyps-removed as described above  RECOMMENDATIONS: Followup on pathology.   _______________________________ eSigned:  R. Garfield Cornea, MD FACP [facility] 03/10/2014 12:57 PM   CC:

## 2014-03-10 NOTE — H&P (View-Only) (Signed)
Primary Care Physician:  Neale Burly, MD Primary Gastroenterologist:  Dr. Gala Romney  Pre-Procedure History & Physical: HPI:  Micheal Young is a 63 y.o. male here for for consideration of a followup colonoscopy. Patient's brother, whom I caree for her over the years, succumbed to colorectal cancer last year. He was diagnosed in his early 83s. Micheal Young  last colonoscopy up in [facility]  April 2011. At that time a small rectal adenoma was found. Currently, not having any lower GI tract symptoms or upper GI tract symptoms for that matter.  Patient was concerned about getting "dehydrated" in preparing for his last 2 colonoscopies. Apparently, just to took the prep and not much in the way a maintenance fluids during the prep.  Past Medical History  Diagnosis Date  . Type 2 diabetes mellitus   . Hypothyroidism   . Mixed hyperlipidemia   . Essential hypertension, benign   . Tubular adenoma 10/04/09    Dr. Rowe Pavy    Past Surgical History  Procedure Laterality Date  . Cholecystectomy    . Vasectomy    . Colonoscopy  10/04/09    Dr.Anwara sessile polyp was found in the rectum, no other abnormalities. bx= tubular adenoma    Prior to Admission medications   Medication Sig Start Date End Date Taking? Authorizing Provider  aspirin EC 81 MG tablet Take 1 tablet (81 mg total) by mouth daily. 12/01/13  Yes Satira Sark, MD  atorvastatin (LIPITOR) 40 MG tablet Take 40 mg by mouth at bedtime. 09/29/13  Yes Historical Provider, MD  LUMIGAN 0.01 % SOLN Place 1 drop into both eyes at bedtime. 09/16/13  Yes Historical Provider, MD  metFORMIN (GLUCOPHAGE-XR) 500 MG 24 hr tablet Take 500 mg by mouth daily. 07/09/13  Yes Historical Provider, MD  metoprolol succinate (TOPROL-XL) 25 MG 24 hr tablet Take 1 tablet by mouth daily. 10/06/13  Yes Historical Provider, MD  SIMBRINZA 1-0.2 % SUSP Place 1 drop into the left eye 2 (two) times daily. 09/09/13  Yes Historical Provider, MD  SYNTHROID 150 MCG tablet Take 150 mcg by  mouth every morning. 09/09/13  Yes Historical Provider, MD  timolol (TIMOPTIC) 0.5 % ophthalmic solution Place 1 drop into both eyes every morning. 09/09/13  Yes Historical Provider, MD    Allergies as of 02/20/2014 - Review Complete 12/01/2013  Allergen Reaction Noted  . Shellfish allergy Swelling 10/05/2013    No family history on file.  History   Social History  . Marital Status: Married    Spouse Name: N/A    Number of Children: N/A  . Years of Education: N/A   Occupational History  . Not on file.   Social History Main Topics  . Smoking status: Former Smoker -- 1.00 packs/day for 17 years    Types: Cigarettes    Start date: 07/10/1968    Quit date: 12/19/1985  . Smokeless tobacco: Never Used     Comment: smoked pipe also  . Alcohol Use: No  . Drug Use: No  . Sexual Activity: Not on file   Other Topics Concern  . Not on file   Social History Narrative  . No narrative on file    Review of Systems: See HPI, otherwise negative ROS  Physical Exam: BP 157/86  Pulse 71  Temp(Src) 97.8 F (36.6 C) (Oral)  Ht 5\' 8"  (1.727 m)  Wt 199 lb 12.8 oz (90.629 kg)  BMI 30.39 kg/m2 General:   Alert,  Well-developed, well-nourished, pleasant and cooperative in NAD  Skin:  Intact without significant lesions or rashes. Eyes:  Sclera clear, no icterus.   Conjunctiva pink. Ears:  Normal auditory acuity. Nose:  No deformity, discharge,  or lesions. Mouth:  No deformity or lesions. Neck:  Supple; no masses or thyromegaly. No significant cervical adenopathy. Lungs:  Clear throughout to auscultation.   No wheezes, crackles, or rhonchi. No acute distress. Heart:  Regular rate and rhythm; no murmurs, clicks, rubs,  or gallops. Abdomen: Non-distended, normal bowel sounds.  Soft and nontender without appreciable mass or hepatosplenomegaly.  Pulses:  Normal pulses noted. Extremities:  Without clubbing or edema.  Impression:   Very pleasant 63 year old gentleman personal history of  colonic adenoma and positive family history of colon cancer in first-degree relative at a relatively young age. Currently, no GI symptoms. It's been 3 1/2 years since he last had a colonoscopy. He desires a colonoscopy at this time. I agree with that approach.  Recommendations:   Reviewed the importance of maintenance fluids in addition to the colonoscopy fluid preparation during the preparation period.  The risks, benefits, limitations, alternatives and imponderables have been reviewed with the patient. Questions have been answered. All parties are agreeable.   Schedule colonoscopy - positive family history of colon cancer and personal history of colon polyps  Review adequate hydration during preparation period  Split Movie prep  Take 1/2 of your metformin the day before colonoscopy and none day of procedure  Further recommendations to follow      Notice: This dictation was prepared with Dragon dictation along with smaller phrase technology. Any transcriptional errors that result from this process are unintentional and may not be corrected upon review.

## 2014-03-10 NOTE — Discharge Instructions (Signed)
°Colonoscopy °Discharge Instructions ° °Read the instructions outlined below and refer to this sheet in the next few weeks. These discharge instructions provide you with general information on caring for yourself after you leave the hospital. Your doctor may also give you specific instructions. While your treatment has been planned according to the most current medical practices available, unavoidable complications occasionally occur. If you have any problems or questions after discharge, call Dr. Rourk at [phone]. °ACTIVITY °· You may resume your regular activity, but move at a slower pace for the next 24 hours.  °· Take frequent rest periods for the next 24 hours.  °· Walking will help get rid of the air and reduce the bloated feeling in your belly (abdomen).  °· No driving for 24 hours (because of the medicine (anesthesia) used during the test).   °· Do not sign any important legal documents or operate any machinery for 24 hours (because of the anesthesia used during the test).  °NUTRITION °· Drink plenty of fluids.  °· You may resume your normal diet as instructed by your doctor.  °· Begin with a light meal and progress to your normal diet. Heavy or fried foods are harder to digest and may make you feel sick to your stomach (nauseated).  °· Avoid alcoholic beverages for 24 hours or as instructed.  °MEDICATIONS °· You may resume your normal medications unless your doctor tells you otherwise.  °WHAT YOU CAN EXPECT TODAY °· Some feelings of bloating in the abdomen.  °· Passage of more gas than usual.  °· Spotting of blood in your stool or on the toilet paper.  °IF YOU HAD POLYPS REMOVED DURING THE COLONOSCOPY: °· No aspirin products for 7 days or as instructed.  °· No alcohol for 7 days or as instructed.  °· Eat a soft diet for the next 24 hours.  °FINDING OUT THE RESULTS OF YOUR TEST °Not all test results are available during your visit. If your test results are not back during the visit, make an appointment  with your caregiver to find out the results. Do not assume everything is normal if you have not heard from your caregiver or the medical facility. It is important for you to follow up on all of your test results.  °SEEK IMMEDIATE MEDICAL ATTENTION IF: °· You have more than a spotting of blood in your stool.  °· Your belly is swollen (abdominal distention).  °· You are nauseated or vomiting.  °· You have a temperature over 101.  °· You have abdominal pain or discomfort that is severe or gets worse throughout the day.  ° ° °Polyp information provided ° °Further recommendations to follow pending review of pathology report ° °Colon Polyps °Polyps are lumps of extra tissue growing inside the body. Polyps can grow in the large intestine (colon). Most colon polyps are noncancerous (benign). However, some colon polyps can become cancerous over time. Polyps that are larger than a pea may be harmful. To be safe, caregivers remove and test all polyps. °CAUSES  °Polyps form when mutations in the genes cause your cells to grow and divide even though no more tissue is needed. °RISK FACTORS °There are a number of risk factors that can increase your chances of getting colon polyps. They include: °· Being older than 50 years. °· Family history of colon polyps or colon cancer. °· Long-term colon diseases, such as colitis or Crohn disease. °· Being overweight. °· Smoking. °· Being inactive. °· Drinking too much alcohol. °SYMPTOMS  °  Most small polyps do not cause symptoms. If symptoms are present, they may include: °· Blood in the stool. The stool may look dark red or black. °· Constipation or diarrhea that lasts longer than 1 week. °DIAGNOSIS °People often do not know they have polyps until their caregiver finds them during a regular checkup. Your caregiver can use 4 tests to check for polyps: °· Digital rectal exam. The caregiver wears gloves and feels inside the rectum. This test would find polyps only in the rectum. °· Barium  enema. The caregiver puts a liquid called barium into your rectum before taking X-rays of your colon. Barium makes your colon look white. Polyps are dark, so they are easy to see in the X-ray pictures. °· Sigmoidoscopy. A thin, flexible tube (sigmoidoscope) is placed into your rectum. The sigmoidoscope has a light and tiny camera in it. The caregiver uses the sigmoidoscope to look at the last third of your colon. °· Colonoscopy. This test is like sigmoidoscopy, but the caregiver looks at the entire colon. This is the most common method for finding and removing polyps. °TREATMENT  °Any polyps will be removed during a sigmoidoscopy or colonoscopy. The polyps are then tested for cancer. °PREVENTION  °To help lower your risk of getting more colon polyps: °· Eat plenty of fruits and vegetables. Avoid eating fatty foods. °· Do not smoke. °· Avoid drinking alcohol. °· Exercise every day. °· Lose weight if recommended by your caregiver. °· Eat plenty of calcium and folate. Foods that are rich in calcium include milk, cheese, and broccoli. Foods that are rich in folate include chickpeas, kidney beans, and spinach. °HOME CARE INSTRUCTIONS °Keep all follow-up appointments as directed by your caregiver. You may need periodic exams to check for polyps. °SEEK MEDICAL CARE IF: °You notice bleeding during a bowel movement. °Document Released: 03/15/2004 Document Revised: 09/11/2011 Document Reviewed: 08/29/2011 °Exit[employer]® Patient Information ©2015 [employer]. This information is not intended to replace advice given to you by your health care provider. Make sure you discuss any questions you have with your health care provider. ° °

## 2014-03-13 ENCOUNTER — Encounter: Payer: Self-pay | Admitting: Internal Medicine

## 2014-03-16 ENCOUNTER — Encounter (HOSPITAL_COMMUNITY): Payer: Self-pay | Admitting: Internal Medicine

## 2014-03-31 NOTE — Telephone Encounter (Signed)
ERROR

## 2014-04-16 DIAGNOSIS — H4011X3 Primary open-angle glaucoma, severe stage: Secondary | ICD-10-CM | POA: Diagnosis not present

## 2014-04-16 DIAGNOSIS — H25013 Cortical age-related cataract, bilateral: Secondary | ICD-10-CM | POA: Diagnosis not present

## 2014-04-16 DIAGNOSIS — H2513 Age-related nuclear cataract, bilateral: Secondary | ICD-10-CM | POA: Diagnosis not present

## 2014-04-16 DIAGNOSIS — E119 Type 2 diabetes mellitus without complications: Secondary | ICD-10-CM | POA: Diagnosis not present

## 2014-04-16 DIAGNOSIS — H4011X2 Primary open-angle glaucoma, moderate stage: Secondary | ICD-10-CM | POA: Diagnosis not present

## 2014-05-04 DIAGNOSIS — H4011X2 Primary open-angle glaucoma, moderate stage: Secondary | ICD-10-CM | POA: Diagnosis not present

## 2014-05-04 DIAGNOSIS — H4011X3 Primary open-angle glaucoma, severe stage: Secondary | ICD-10-CM | POA: Diagnosis not present

## 2014-05-18 DIAGNOSIS — H4011X3 Primary open-angle glaucoma, severe stage: Secondary | ICD-10-CM | POA: Diagnosis not present

## 2014-05-18 DIAGNOSIS — H4011X2 Primary open-angle glaucoma, moderate stage: Secondary | ICD-10-CM | POA: Diagnosis not present

## 2014-06-15 DIAGNOSIS — J018 Other acute sinusitis: Secondary | ICD-10-CM | POA: Diagnosis not present

## 2014-07-27 DIAGNOSIS — H4011X3 Primary open-angle glaucoma, severe stage: Secondary | ICD-10-CM | POA: Diagnosis not present

## 2014-09-14 DIAGNOSIS — H4011X3 Primary open-angle glaucoma, severe stage: Secondary | ICD-10-CM | POA: Diagnosis not present

## 2014-09-14 DIAGNOSIS — H4011X2 Primary open-angle glaucoma, moderate stage: Secondary | ICD-10-CM | POA: Diagnosis not present

## 2014-09-14 DIAGNOSIS — H40053 Ocular hypertension, bilateral: Secondary | ICD-10-CM | POA: Diagnosis not present

## 2014-10-15 DIAGNOSIS — H4011X2 Primary open-angle glaucoma, moderate stage: Secondary | ICD-10-CM | POA: Diagnosis not present

## 2014-11-16 DIAGNOSIS — H4011X3 Primary open-angle glaucoma, severe stage: Secondary | ICD-10-CM | POA: Diagnosis not present

## 2015-01-18 DIAGNOSIS — H4011X2 Primary open-angle glaucoma, moderate stage: Secondary | ICD-10-CM | POA: Diagnosis not present

## 2015-01-27 DIAGNOSIS — E039 Hypothyroidism, unspecified: Secondary | ICD-10-CM | POA: Diagnosis not present

## 2015-01-27 DIAGNOSIS — E119 Type 2 diabetes mellitus without complications: Secondary | ICD-10-CM | POA: Diagnosis not present

## 2015-01-27 DIAGNOSIS — Z125 Encounter for screening for malignant neoplasm of prostate: Secondary | ICD-10-CM | POA: Diagnosis not present

## 2015-01-27 DIAGNOSIS — I10 Essential (primary) hypertension: Secondary | ICD-10-CM | POA: Diagnosis not present

## 2015-02-04 DIAGNOSIS — I10 Essential (primary) hypertension: Secondary | ICD-10-CM | POA: Diagnosis not present

## 2015-02-04 DIAGNOSIS — E1165 Type 2 diabetes mellitus with hyperglycemia: Secondary | ICD-10-CM | POA: Diagnosis not present

## 2015-02-04 DIAGNOSIS — Z0000 Encounter for general adult medical examination without abnormal findings: Secondary | ICD-10-CM | POA: Diagnosis not present

## 2015-04-22 DIAGNOSIS — H401112 Primary open-angle glaucoma, right eye, moderate stage: Secondary | ICD-10-CM | POA: Diagnosis not present

## 2015-04-22 DIAGNOSIS — H401123 Primary open-angle glaucoma, left eye, severe stage: Secondary | ICD-10-CM | POA: Diagnosis not present

## 2015-04-22 DIAGNOSIS — H25013 Cortical age-related cataract, bilateral: Secondary | ICD-10-CM | POA: Diagnosis not present

## 2015-04-22 DIAGNOSIS — H2513 Age-related nuclear cataract, bilateral: Secondary | ICD-10-CM | POA: Diagnosis not present

## 2015-05-13 DIAGNOSIS — H40052 Ocular hypertension, left eye: Secondary | ICD-10-CM | POA: Diagnosis not present

## 2015-05-13 DIAGNOSIS — H11153 Pinguecula, bilateral: Secondary | ICD-10-CM | POA: Diagnosis not present

## 2015-05-13 DIAGNOSIS — H401112 Primary open-angle glaucoma, right eye, moderate stage: Secondary | ICD-10-CM | POA: Diagnosis not present

## 2015-05-13 DIAGNOSIS — H401123 Primary open-angle glaucoma, left eye, severe stage: Secondary | ICD-10-CM | POA: Diagnosis not present

## 2015-08-16 IMAGING — CR DG CHEST 1V PORT
1 series · 1 of 1 positions shown · non-contrast
Comparison: Prior radiograph from 10/03/2009

CLINICAL DATA: Cough, congestion

EXAM:
PORTABLE CHEST - 1 VIEW

[portable]
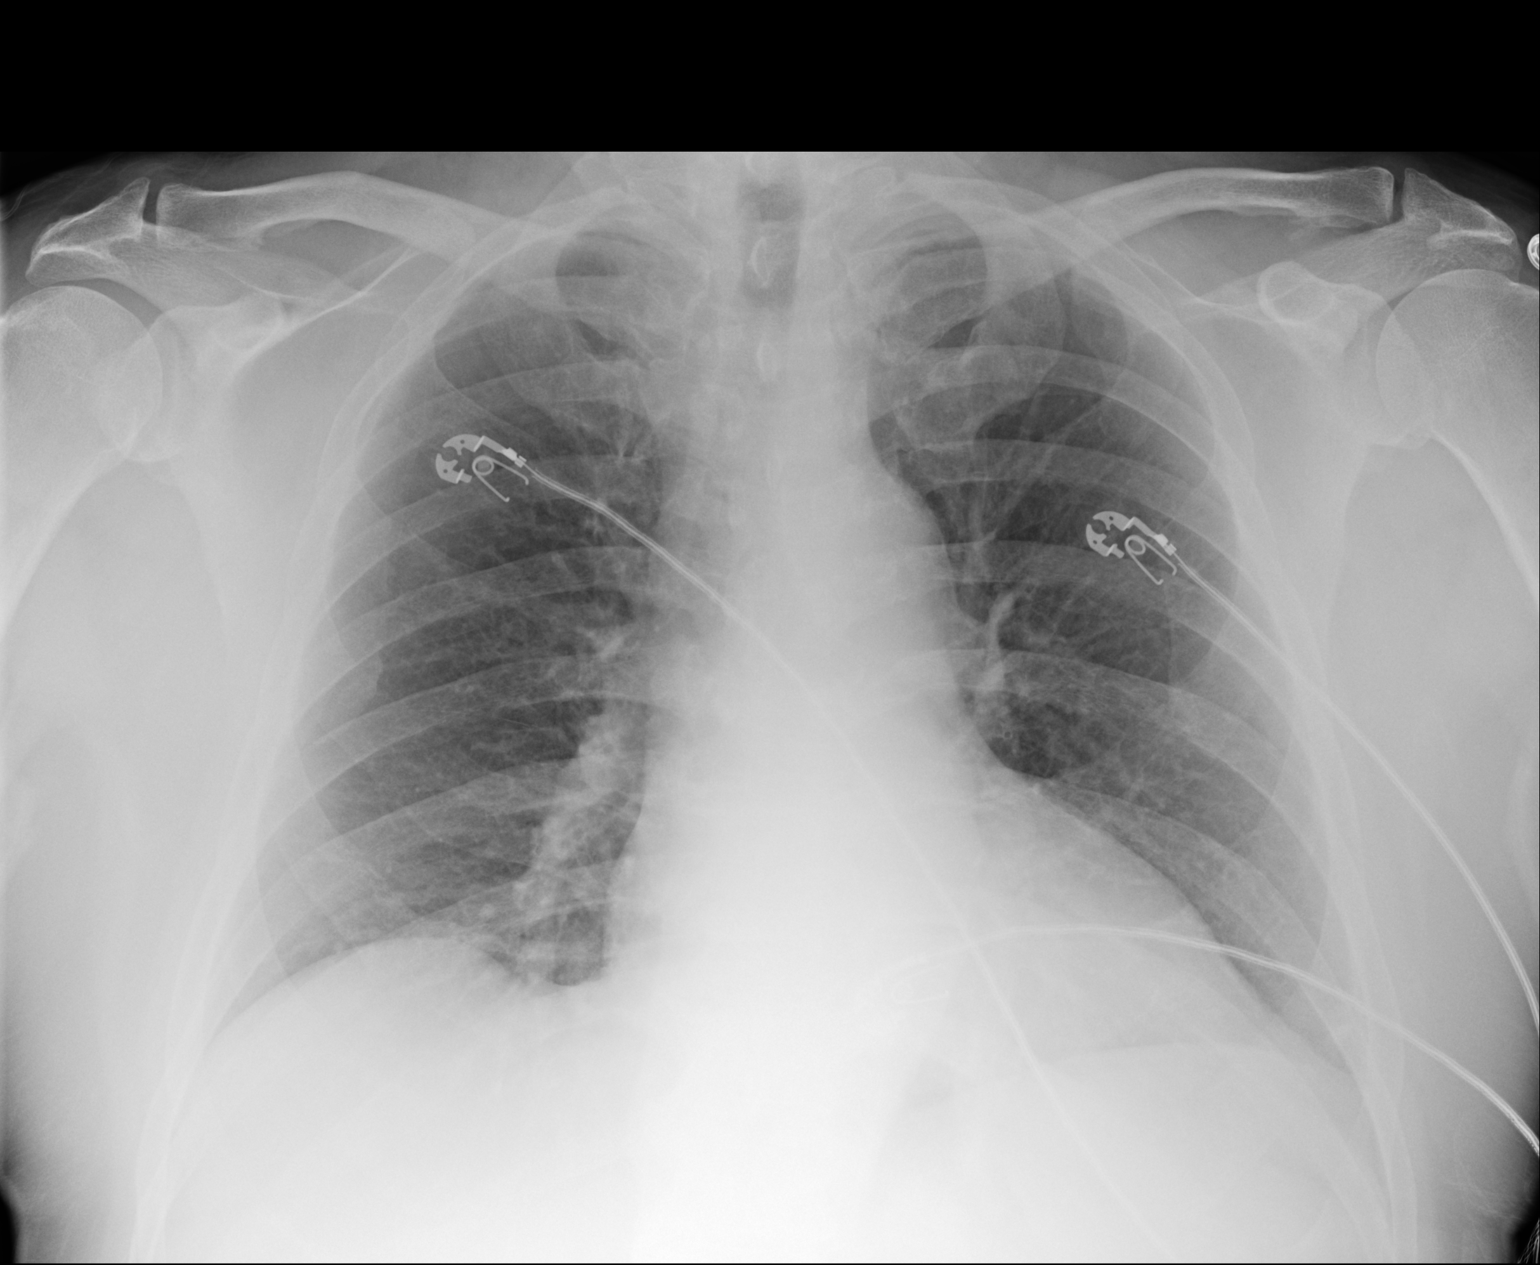

[1 of 1 positions shown; findings below may reference images not displayed]

FINDINGS: The cardiac and mediastinal silhouettes are stable in size and
contour, and remain within normal limits.

The lungs are normally inflated. Mild diffuse bronchitic changes are
present. No airspace consolidation, pleural effusion, or pulmonary
edema is identified. There is no pneumothorax.

No acute osseous abnormality identified.
IMPRESSION: Mild diffuse bronchitic changes, suggesting bronchiolitis. No focal
infiltrates identified.

## 2015-09-16 DIAGNOSIS — H401112 Primary open-angle glaucoma, right eye, moderate stage: Secondary | ICD-10-CM | POA: Diagnosis not present

## 2015-09-16 DIAGNOSIS — H40052 Ocular hypertension, left eye: Secondary | ICD-10-CM | POA: Diagnosis not present

## 2015-09-16 DIAGNOSIS — H401123 Primary open-angle glaucoma, left eye, severe stage: Secondary | ICD-10-CM | POA: Diagnosis not present

## 2015-09-16 DIAGNOSIS — H524 Presbyopia: Secondary | ICD-10-CM | POA: Diagnosis not present

## 2015-09-23 DIAGNOSIS — H401112 Primary open-angle glaucoma, right eye, moderate stage: Secondary | ICD-10-CM | POA: Diagnosis not present

## 2015-09-27 DIAGNOSIS — E1165 Type 2 diabetes mellitus with hyperglycemia: Secondary | ICD-10-CM | POA: Diagnosis not present

## 2015-09-27 DIAGNOSIS — J3089 Other allergic rhinitis: Secondary | ICD-10-CM | POA: Diagnosis not present

## 2015-09-27 DIAGNOSIS — I10 Essential (primary) hypertension: Secondary | ICD-10-CM | POA: Diagnosis not present

## 2015-10-25 DIAGNOSIS — H401123 Primary open-angle glaucoma, left eye, severe stage: Secondary | ICD-10-CM | POA: Diagnosis not present

## 2015-12-20 DIAGNOSIS — H401123 Primary open-angle glaucoma, left eye, severe stage: Secondary | ICD-10-CM | POA: Diagnosis not present

## 2015-12-20 DIAGNOSIS — H40051 Ocular hypertension, right eye: Secondary | ICD-10-CM | POA: Diagnosis not present

## 2015-12-20 DIAGNOSIS — H40052 Ocular hypertension, left eye: Secondary | ICD-10-CM | POA: Diagnosis not present

## 2015-12-20 DIAGNOSIS — H401112 Primary open-angle glaucoma, right eye, moderate stage: Secondary | ICD-10-CM | POA: Diagnosis not present

## 2015-12-30 DIAGNOSIS — H401131 Primary open-angle glaucoma, bilateral, mild stage: Secondary | ICD-10-CM | POA: Diagnosis not present

## 2016-01-06 DIAGNOSIS — H401123 Primary open-angle glaucoma, left eye, severe stage: Secondary | ICD-10-CM | POA: Diagnosis not present

## 2016-01-06 DIAGNOSIS — H401112 Primary open-angle glaucoma, right eye, moderate stage: Secondary | ICD-10-CM | POA: Diagnosis not present

## 2016-01-06 DIAGNOSIS — H40052 Ocular hypertension, left eye: Secondary | ICD-10-CM | POA: Diagnosis not present

## 2016-01-06 DIAGNOSIS — H40051 Ocular hypertension, right eye: Secondary | ICD-10-CM | POA: Diagnosis not present

## 2016-01-24 DIAGNOSIS — H401121 Primary open-angle glaucoma, left eye, mild stage: Secondary | ICD-10-CM | POA: Diagnosis not present

## 2016-01-24 DIAGNOSIS — H401113 Primary open-angle glaucoma, right eye, severe stage: Secondary | ICD-10-CM | POA: Diagnosis not present

## 2016-01-24 DIAGNOSIS — E119 Type 2 diabetes mellitus without complications: Secondary | ICD-10-CM | POA: Diagnosis not present

## 2016-01-24 DIAGNOSIS — H25813 Combined forms of age-related cataract, bilateral: Secondary | ICD-10-CM | POA: Diagnosis not present

## 2016-02-02 DIAGNOSIS — E038 Other specified hypothyroidism: Secondary | ICD-10-CM | POA: Diagnosis not present

## 2016-02-02 DIAGNOSIS — I10 Essential (primary) hypertension: Secondary | ICD-10-CM | POA: Diagnosis not present

## 2016-02-02 DIAGNOSIS — Z125 Encounter for screening for malignant neoplasm of prostate: Secondary | ICD-10-CM | POA: Diagnosis not present

## 2016-02-02 DIAGNOSIS — E1165 Type 2 diabetes mellitus with hyperglycemia: Secondary | ICD-10-CM | POA: Diagnosis not present

## 2016-02-14 DIAGNOSIS — Z1389 Encounter for screening for other disorder: Secondary | ICD-10-CM | POA: Diagnosis not present

## 2016-02-14 DIAGNOSIS — H4089 Other specified glaucoma: Secondary | ICD-10-CM | POA: Diagnosis not present

## 2016-02-14 DIAGNOSIS — R5383 Other fatigue: Secondary | ICD-10-CM | POA: Diagnosis not present

## 2016-02-14 DIAGNOSIS — E038 Other specified hypothyroidism: Secondary | ICD-10-CM | POA: Diagnosis not present

## 2016-02-14 DIAGNOSIS — E1165 Type 2 diabetes mellitus with hyperglycemia: Secondary | ICD-10-CM | POA: Diagnosis not present

## 2016-02-14 DIAGNOSIS — Z23 Encounter for immunization: Secondary | ICD-10-CM | POA: Diagnosis not present

## 2016-02-14 DIAGNOSIS — Z0000 Encounter for general adult medical examination without abnormal findings: Secondary | ICD-10-CM | POA: Diagnosis not present

## 2016-02-18 DIAGNOSIS — H401113 Primary open-angle glaucoma, right eye, severe stage: Secondary | ICD-10-CM | POA: Diagnosis not present

## 2016-03-31 DIAGNOSIS — H401121 Primary open-angle glaucoma, left eye, mild stage: Secondary | ICD-10-CM | POA: Diagnosis not present

## 2016-08-10 DIAGNOSIS — E038 Other specified hypothyroidism: Secondary | ICD-10-CM | POA: Diagnosis not present

## 2016-08-10 DIAGNOSIS — E1165 Type 2 diabetes mellitus with hyperglycemia: Secondary | ICD-10-CM | POA: Diagnosis not present

## 2016-08-10 DIAGNOSIS — H4089 Other specified glaucoma: Secondary | ICD-10-CM | POA: Diagnosis not present

## 2016-08-16 DIAGNOSIS — Z79899 Other long term (current) drug therapy: Secondary | ICD-10-CM | POA: Diagnosis not present

## 2016-08-16 DIAGNOSIS — E1165 Type 2 diabetes mellitus with hyperglycemia: Secondary | ICD-10-CM | POA: Diagnosis not present

## 2016-09-01 DIAGNOSIS — H401123 Primary open-angle glaucoma, left eye, severe stage: Secondary | ICD-10-CM | POA: Diagnosis not present

## 2016-09-12 DIAGNOSIS — H401123 Primary open-angle glaucoma, left eye, severe stage: Secondary | ICD-10-CM | POA: Diagnosis not present

## 2016-09-12 DIAGNOSIS — H25812 Combined forms of age-related cataract, left eye: Secondary | ICD-10-CM | POA: Diagnosis not present

## 2016-09-12 DIAGNOSIS — H25811 Combined forms of age-related cataract, right eye: Secondary | ICD-10-CM | POA: Diagnosis not present

## 2016-09-12 DIAGNOSIS — H401111 Primary open-angle glaucoma, right eye, mild stage: Secondary | ICD-10-CM | POA: Diagnosis not present

## 2016-09-26 DIAGNOSIS — H25812 Combined forms of age-related cataract, left eye: Secondary | ICD-10-CM | POA: Diagnosis not present

## 2016-09-26 DIAGNOSIS — H401111 Primary open-angle glaucoma, right eye, mild stage: Secondary | ICD-10-CM | POA: Diagnosis not present

## 2016-09-26 DIAGNOSIS — H25811 Combined forms of age-related cataract, right eye: Secondary | ICD-10-CM | POA: Diagnosis not present

## 2016-09-26 DIAGNOSIS — H401123 Primary open-angle glaucoma, left eye, severe stage: Secondary | ICD-10-CM | POA: Diagnosis not present

## 2016-10-10 DIAGNOSIS — H401123 Primary open-angle glaucoma, left eye, severe stage: Secondary | ICD-10-CM | POA: Diagnosis not present

## 2016-10-30 DIAGNOSIS — H25812 Combined forms of age-related cataract, left eye: Secondary | ICD-10-CM | POA: Diagnosis not present

## 2016-10-30 DIAGNOSIS — H401123 Primary open-angle glaucoma, left eye, severe stage: Secondary | ICD-10-CM | POA: Diagnosis not present

## 2016-11-06 DIAGNOSIS — E038 Other specified hypothyroidism: Secondary | ICD-10-CM | POA: Diagnosis not present

## 2016-11-06 DIAGNOSIS — R630 Anorexia: Secondary | ICD-10-CM | POA: Diagnosis not present

## 2016-11-06 DIAGNOSIS — E1165 Type 2 diabetes mellitus with hyperglycemia: Secondary | ICD-10-CM | POA: Diagnosis not present

## 2016-11-10 DIAGNOSIS — R634 Abnormal weight loss: Secondary | ICD-10-CM | POA: Diagnosis not present

## 2016-11-10 DIAGNOSIS — I700 Atherosclerosis of aorta: Secondary | ICD-10-CM | POA: Diagnosis not present

## 2016-11-10 DIAGNOSIS — R630 Anorexia: Secondary | ICD-10-CM | POA: Diagnosis not present

## 2016-12-11 DIAGNOSIS — H401111 Primary open-angle glaucoma, right eye, mild stage: Secondary | ICD-10-CM | POA: Diagnosis not present

## 2016-12-11 DIAGNOSIS — H25811 Combined forms of age-related cataract, right eye: Secondary | ICD-10-CM | POA: Diagnosis not present

## 2016-12-11 DIAGNOSIS — H401110 Primary open-angle glaucoma, right eye, stage unspecified: Secondary | ICD-10-CM | POA: Diagnosis not present

## 2017-02-05 DIAGNOSIS — I700 Atherosclerosis of aorta: Secondary | ICD-10-CM | POA: Diagnosis not present

## 2017-02-05 DIAGNOSIS — R5383 Other fatigue: Secondary | ICD-10-CM | POA: Diagnosis not present

## 2017-02-05 DIAGNOSIS — E119 Type 2 diabetes mellitus without complications: Secondary | ICD-10-CM | POA: Diagnosis not present

## 2017-02-05 DIAGNOSIS — Z79899 Other long term (current) drug therapy: Secondary | ICD-10-CM | POA: Diagnosis not present

## 2017-02-05 DIAGNOSIS — E038 Other specified hypothyroidism: Secondary | ICD-10-CM | POA: Diagnosis not present

## 2017-02-05 DIAGNOSIS — Z125 Encounter for screening for malignant neoplasm of prostate: Secondary | ICD-10-CM | POA: Diagnosis not present

## 2017-02-05 DIAGNOSIS — I10 Essential (primary) hypertension: Secondary | ICD-10-CM | POA: Diagnosis not present

## 2017-02-05 DIAGNOSIS — E1165 Type 2 diabetes mellitus with hyperglycemia: Secondary | ICD-10-CM | POA: Diagnosis not present

## 2017-04-13 DIAGNOSIS — H401111 Primary open-angle glaucoma, right eye, mild stage: Secondary | ICD-10-CM | POA: Diagnosis not present

## 2017-04-13 DIAGNOSIS — H401123 Primary open-angle glaucoma, left eye, severe stage: Secondary | ICD-10-CM | POA: Diagnosis not present

## 2017-04-27 DIAGNOSIS — H401133 Primary open-angle glaucoma, bilateral, severe stage: Secondary | ICD-10-CM | POA: Diagnosis not present

## 2017-05-10 DIAGNOSIS — E039 Hypothyroidism, unspecified: Secondary | ICD-10-CM | POA: Diagnosis not present

## 2017-05-10 DIAGNOSIS — Z79899 Other long term (current) drug therapy: Secondary | ICD-10-CM | POA: Diagnosis not present

## 2017-05-10 DIAGNOSIS — H4010X3 Unspecified open-angle glaucoma, severe stage: Secondary | ICD-10-CM | POA: Diagnosis not present

## 2017-05-10 DIAGNOSIS — E119 Type 2 diabetes mellitus without complications: Secondary | ICD-10-CM | POA: Diagnosis not present

## 2017-05-10 DIAGNOSIS — I10 Essential (primary) hypertension: Secondary | ICD-10-CM | POA: Diagnosis not present

## 2017-05-10 DIAGNOSIS — Z7982 Long term (current) use of aspirin: Secondary | ICD-10-CM | POA: Diagnosis not present

## 2017-05-10 DIAGNOSIS — Z7984 Long term (current) use of oral hypoglycemic drugs: Secondary | ICD-10-CM | POA: Diagnosis not present

## 2017-05-10 DIAGNOSIS — H401113 Primary open-angle glaucoma, right eye, severe stage: Secondary | ICD-10-CM | POA: Diagnosis not present

## 2017-05-10 DIAGNOSIS — Z91013 Allergy to seafood: Secondary | ICD-10-CM | POA: Diagnosis not present

## 2017-05-16 DIAGNOSIS — H401133 Primary open-angle glaucoma, bilateral, severe stage: Secondary | ICD-10-CM | POA: Diagnosis not present

## 2017-05-16 DIAGNOSIS — H4311 Vitreous hemorrhage, right eye: Secondary | ICD-10-CM | POA: Diagnosis not present

## 2017-05-16 DIAGNOSIS — H2101 Hyphema, right eye: Secondary | ICD-10-CM | POA: Diagnosis not present

## 2017-05-29 DIAGNOSIS — T85398A Other mechanical complication of other ocular prosthetic devices, implants and grafts, initial encounter: Secondary | ICD-10-CM | POA: Diagnosis not present

## 2017-05-29 DIAGNOSIS — I10 Essential (primary) hypertension: Secondary | ICD-10-CM | POA: Diagnosis not present

## 2017-05-29 DIAGNOSIS — H2101 Hyphema, right eye: Secondary | ICD-10-CM | POA: Diagnosis not present

## 2017-05-29 DIAGNOSIS — H4010X0 Unspecified open-angle glaucoma, stage unspecified: Secondary | ICD-10-CM | POA: Diagnosis not present

## 2017-05-29 DIAGNOSIS — E119 Type 2 diabetes mellitus without complications: Secondary | ICD-10-CM | POA: Diagnosis not present

## 2017-05-29 DIAGNOSIS — H401113 Primary open-angle glaucoma, right eye, severe stage: Secondary | ICD-10-CM | POA: Diagnosis not present

## 2017-05-29 DIAGNOSIS — Z87891 Personal history of nicotine dependence: Secondary | ICD-10-CM | POA: Diagnosis not present

## 2017-05-29 DIAGNOSIS — H401133 Primary open-angle glaucoma, bilateral, severe stage: Secondary | ICD-10-CM | POA: Diagnosis not present

## 2017-05-29 DIAGNOSIS — E039 Hypothyroidism, unspecified: Secondary | ICD-10-CM | POA: Diagnosis not present

## 2017-06-12 DIAGNOSIS — H26493 Other secondary cataract, bilateral: Secondary | ICD-10-CM | POA: Diagnosis not present

## 2017-06-21 DIAGNOSIS — H11241 Scarring of conjunctiva, right eye: Secondary | ICD-10-CM | POA: Diagnosis not present

## 2017-07-27 DIAGNOSIS — H26491 Other secondary cataract, right eye: Secondary | ICD-10-CM | POA: Diagnosis not present

## 2017-07-27 DIAGNOSIS — H401133 Primary open-angle glaucoma, bilateral, severe stage: Secondary | ICD-10-CM | POA: Diagnosis not present

## 2017-08-16 DIAGNOSIS — Z125 Encounter for screening for malignant neoplasm of prostate: Secondary | ICD-10-CM | POA: Diagnosis not present

## 2017-08-16 DIAGNOSIS — I10 Essential (primary) hypertension: Secondary | ICD-10-CM | POA: Diagnosis not present

## 2017-08-16 DIAGNOSIS — E038 Other specified hypothyroidism: Secondary | ICD-10-CM | POA: Diagnosis not present

## 2017-08-16 DIAGNOSIS — E119 Type 2 diabetes mellitus without complications: Secondary | ICD-10-CM | POA: Diagnosis not present

## 2017-08-16 DIAGNOSIS — I700 Atherosclerosis of aorta: Secondary | ICD-10-CM | POA: Diagnosis not present

## 2017-08-28 DIAGNOSIS — Z79899 Other long term (current) drug therapy: Secondary | ICD-10-CM | POA: Diagnosis not present

## 2017-08-28 DIAGNOSIS — E119 Type 2 diabetes mellitus without complications: Secondary | ICD-10-CM | POA: Diagnosis not present

## 2017-08-28 DIAGNOSIS — E039 Hypothyroidism, unspecified: Secondary | ICD-10-CM | POA: Diagnosis not present

## 2017-08-28 DIAGNOSIS — H401113 Primary open-angle glaucoma, right eye, severe stage: Secondary | ICD-10-CM | POA: Diagnosis not present

## 2017-08-28 DIAGNOSIS — H401133 Primary open-angle glaucoma, bilateral, severe stage: Secondary | ICD-10-CM | POA: Diagnosis not present

## 2017-08-28 DIAGNOSIS — I10 Essential (primary) hypertension: Secondary | ICD-10-CM | POA: Diagnosis not present

## 2017-08-28 DIAGNOSIS — Z7984 Long term (current) use of oral hypoglycemic drugs: Secondary | ICD-10-CM | POA: Diagnosis not present

## 2017-08-28 DIAGNOSIS — H401123 Primary open-angle glaucoma, left eye, severe stage: Secondary | ICD-10-CM | POA: Diagnosis not present

## 2017-08-28 DIAGNOSIS — Z91013 Allergy to seafood: Secondary | ICD-10-CM | POA: Diagnosis not present

## 2017-08-28 DIAGNOSIS — Z7982 Long term (current) use of aspirin: Secondary | ICD-10-CM | POA: Diagnosis not present

## 2017-08-28 DIAGNOSIS — Z87891 Personal history of nicotine dependence: Secondary | ICD-10-CM | POA: Diagnosis not present

## 2017-09-06 DIAGNOSIS — H11242 Scarring of conjunctiva, left eye: Secondary | ICD-10-CM | POA: Diagnosis not present

## 2017-09-19 DIAGNOSIS — I10 Essential (primary) hypertension: Secondary | ICD-10-CM | POA: Diagnosis not present

## 2017-09-19 DIAGNOSIS — H4010X3 Unspecified open-angle glaucoma, severe stage: Secondary | ICD-10-CM | POA: Diagnosis not present

## 2017-09-19 DIAGNOSIS — E039 Hypothyroidism, unspecified: Secondary | ICD-10-CM | POA: Diagnosis not present

## 2017-09-19 DIAGNOSIS — H401113 Primary open-angle glaucoma, right eye, severe stage: Secondary | ICD-10-CM | POA: Diagnosis not present

## 2017-09-19 DIAGNOSIS — E119 Type 2 diabetes mellitus without complications: Secondary | ICD-10-CM | POA: Diagnosis not present

## 2017-09-19 DIAGNOSIS — Z87891 Personal history of nicotine dependence: Secondary | ICD-10-CM | POA: Diagnosis not present

## 2017-09-19 DIAGNOSIS — I4891 Unspecified atrial fibrillation: Secondary | ICD-10-CM | POA: Diagnosis not present

## 2017-09-25 DIAGNOSIS — H401133 Primary open-angle glaucoma, bilateral, severe stage: Secondary | ICD-10-CM | POA: Diagnosis not present

## 2017-10-01 DIAGNOSIS — Z87891 Personal history of nicotine dependence: Secondary | ICD-10-CM | POA: Diagnosis not present

## 2017-10-01 DIAGNOSIS — Z79899 Other long term (current) drug therapy: Secondary | ICD-10-CM | POA: Diagnosis not present

## 2017-10-01 DIAGNOSIS — H401113 Primary open-angle glaucoma, right eye, severe stage: Secondary | ICD-10-CM | POA: Diagnosis not present

## 2017-10-01 DIAGNOSIS — Z7982 Long term (current) use of aspirin: Secondary | ICD-10-CM | POA: Diagnosis not present

## 2017-10-01 DIAGNOSIS — E039 Hypothyroidism, unspecified: Secondary | ICD-10-CM | POA: Diagnosis not present

## 2017-10-01 DIAGNOSIS — Z7984 Long term (current) use of oral hypoglycemic drugs: Secondary | ICD-10-CM | POA: Diagnosis not present

## 2017-10-01 DIAGNOSIS — I10 Essential (primary) hypertension: Secondary | ICD-10-CM | POA: Diagnosis not present

## 2017-10-01 DIAGNOSIS — H401133 Primary open-angle glaucoma, bilateral, severe stage: Secondary | ICD-10-CM | POA: Diagnosis not present

## 2017-10-01 DIAGNOSIS — E119 Type 2 diabetes mellitus without complications: Secondary | ICD-10-CM | POA: Diagnosis not present

## 2018-01-29 DIAGNOSIS — H401133 Primary open-angle glaucoma, bilateral, severe stage: Secondary | ICD-10-CM | POA: Diagnosis not present

## 2018-02-05 DIAGNOSIS — H401133 Primary open-angle glaucoma, bilateral, severe stage: Secondary | ICD-10-CM | POA: Diagnosis not present

## 2018-02-20 DIAGNOSIS — I10 Essential (primary) hypertension: Secondary | ICD-10-CM | POA: Diagnosis not present

## 2018-02-20 DIAGNOSIS — I700 Atherosclerosis of aorta: Secondary | ICD-10-CM | POA: Diagnosis not present

## 2018-02-20 DIAGNOSIS — E038 Other specified hypothyroidism: Secondary | ICD-10-CM | POA: Diagnosis not present

## 2018-02-20 DIAGNOSIS — E119 Type 2 diabetes mellitus without complications: Secondary | ICD-10-CM | POA: Diagnosis not present

## 2018-04-02 DIAGNOSIS — H401133 Primary open-angle glaucoma, bilateral, severe stage: Secondary | ICD-10-CM | POA: Diagnosis not present

## 2018-07-09 DIAGNOSIS — H401133 Primary open-angle glaucoma, bilateral, severe stage: Secondary | ICD-10-CM | POA: Diagnosis not present

## 2018-08-13 DIAGNOSIS — H401133 Primary open-angle glaucoma, bilateral, severe stage: Secondary | ICD-10-CM | POA: Diagnosis not present

## 2018-08-20 DIAGNOSIS — H40013 Open angle with borderline findings, low risk, bilateral: Secondary | ICD-10-CM | POA: Diagnosis not present

## 2018-08-20 DIAGNOSIS — H401133 Primary open-angle glaucoma, bilateral, severe stage: Secondary | ICD-10-CM | POA: Diagnosis not present

## 2018-08-22 DIAGNOSIS — H409 Unspecified glaucoma: Secondary | ICD-10-CM | POA: Diagnosis not present

## 2018-08-22 DIAGNOSIS — E119 Type 2 diabetes mellitus without complications: Secondary | ICD-10-CM | POA: Diagnosis not present

## 2018-08-22 DIAGNOSIS — E038 Other specified hypothyroidism: Secondary | ICD-10-CM | POA: Diagnosis not present

## 2018-08-22 DIAGNOSIS — Z0000 Encounter for general adult medical examination without abnormal findings: Secondary | ICD-10-CM | POA: Diagnosis not present

## 2018-08-22 DIAGNOSIS — I700 Atherosclerosis of aorta: Secondary | ICD-10-CM | POA: Diagnosis not present

## 2018-08-22 DIAGNOSIS — Z1389 Encounter for screening for other disorder: Secondary | ICD-10-CM | POA: Diagnosis not present

## 2018-08-22 DIAGNOSIS — I10 Essential (primary) hypertension: Secondary | ICD-10-CM | POA: Diagnosis not present

## 2018-08-22 DIAGNOSIS — Z125 Encounter for screening for malignant neoplasm of prostate: Secondary | ICD-10-CM | POA: Diagnosis not present

## 2018-09-06 DIAGNOSIS — Z87891 Personal history of nicotine dependence: Secondary | ICD-10-CM | POA: Diagnosis not present

## 2018-09-06 DIAGNOSIS — Z136 Encounter for screening for cardiovascular disorders: Secondary | ICD-10-CM | POA: Diagnosis not present

## 2018-11-05 DIAGNOSIS — H53143 Visual discomfort, bilateral: Secondary | ICD-10-CM | POA: Diagnosis not present

## 2018-11-05 DIAGNOSIS — H401133 Primary open-angle glaucoma, bilateral, severe stage: Secondary | ICD-10-CM | POA: Diagnosis not present

## 2019-01-31 ENCOUNTER — Other Ambulatory Visit: Payer: Self-pay

## 2019-02-03 DIAGNOSIS — S0501XA Injury of conjunctiva and corneal abrasion without foreign body, right eye, initial encounter: Secondary | ICD-10-CM | POA: Diagnosis not present

## 2019-02-05 DIAGNOSIS — H53143 Visual discomfort, bilateral: Secondary | ICD-10-CM | POA: Diagnosis not present

## 2019-02-05 DIAGNOSIS — H401133 Primary open-angle glaucoma, bilateral, severe stage: Secondary | ICD-10-CM | POA: Diagnosis not present

## 2019-02-20 DIAGNOSIS — Z01812 Encounter for preprocedural laboratory examination: Secondary | ICD-10-CM | POA: Diagnosis not present

## 2019-02-20 DIAGNOSIS — Z20828 Contact with and (suspected) exposure to other viral communicable diseases: Secondary | ICD-10-CM | POA: Diagnosis not present

## 2019-02-20 DIAGNOSIS — H4010X0 Unspecified open-angle glaucoma, stage unspecified: Secondary | ICD-10-CM | POA: Diagnosis not present

## 2019-02-24 DIAGNOSIS — Z91013 Allergy to seafood: Secondary | ICD-10-CM | POA: Diagnosis not present

## 2019-02-24 DIAGNOSIS — I10 Essential (primary) hypertension: Secondary | ICD-10-CM | POA: Diagnosis not present

## 2019-02-24 DIAGNOSIS — Z79899 Other long term (current) drug therapy: Secondary | ICD-10-CM | POA: Diagnosis not present

## 2019-02-24 DIAGNOSIS — E039 Hypothyroidism, unspecified: Secondary | ICD-10-CM | POA: Diagnosis not present

## 2019-02-24 DIAGNOSIS — Z7982 Long term (current) use of aspirin: Secondary | ICD-10-CM | POA: Diagnosis not present

## 2019-02-24 DIAGNOSIS — Z87891 Personal history of nicotine dependence: Secondary | ICD-10-CM | POA: Diagnosis not present

## 2019-02-24 DIAGNOSIS — E119 Type 2 diabetes mellitus without complications: Secondary | ICD-10-CM | POA: Diagnosis not present

## 2019-02-24 DIAGNOSIS — Z7984 Long term (current) use of oral hypoglycemic drugs: Secondary | ICD-10-CM | POA: Diagnosis not present

## 2019-02-24 DIAGNOSIS — H401133 Primary open-angle glaucoma, bilateral, severe stage: Secondary | ICD-10-CM | POA: Diagnosis not present

## 2019-03-11 DIAGNOSIS — I10 Essential (primary) hypertension: Secondary | ICD-10-CM | POA: Diagnosis not present

## 2019-03-11 DIAGNOSIS — E038 Other specified hypothyroidism: Secondary | ICD-10-CM | POA: Diagnosis not present

## 2019-03-11 DIAGNOSIS — I700 Atherosclerosis of aorta: Secondary | ICD-10-CM | POA: Diagnosis not present

## 2019-03-11 DIAGNOSIS — H409 Unspecified glaucoma: Secondary | ICD-10-CM | POA: Diagnosis not present

## 2019-03-11 DIAGNOSIS — E119 Type 2 diabetes mellitus without complications: Secondary | ICD-10-CM | POA: Diagnosis not present

## 2019-03-17 ENCOUNTER — Encounter: Payer: Self-pay | Admitting: Internal Medicine

## 2019-03-28 DIAGNOSIS — I10 Essential (primary) hypertension: Secondary | ICD-10-CM | POA: Diagnosis not present

## 2019-03-28 DIAGNOSIS — Z0000 Encounter for general adult medical examination without abnormal findings: Secondary | ICD-10-CM | POA: Diagnosis not present

## 2019-03-28 DIAGNOSIS — E119 Type 2 diabetes mellitus without complications: Secondary | ICD-10-CM | POA: Diagnosis not present

## 2019-03-28 DIAGNOSIS — E038 Other specified hypothyroidism: Secondary | ICD-10-CM | POA: Diagnosis not present

## 2019-08-14 DIAGNOSIS — Z23 Encounter for immunization: Secondary | ICD-10-CM | POA: Diagnosis not present

## 2019-09-08 DIAGNOSIS — H409 Unspecified glaucoma: Secondary | ICD-10-CM | POA: Diagnosis not present

## 2019-09-08 DIAGNOSIS — I700 Atherosclerosis of aorta: Secondary | ICD-10-CM | POA: Diagnosis not present

## 2019-09-08 DIAGNOSIS — I10 Essential (primary) hypertension: Secondary | ICD-10-CM | POA: Diagnosis not present

## 2019-09-08 DIAGNOSIS — E038 Other specified hypothyroidism: Secondary | ICD-10-CM | POA: Diagnosis not present

## 2019-09-08 DIAGNOSIS — Z1331 Encounter for screening for depression: Secondary | ICD-10-CM | POA: Diagnosis not present

## 2019-09-08 DIAGNOSIS — Z0000 Encounter for general adult medical examination without abnormal findings: Secondary | ICD-10-CM | POA: Diagnosis not present

## 2019-09-08 DIAGNOSIS — E1121 Type 2 diabetes mellitus with diabetic nephropathy: Secondary | ICD-10-CM | POA: Diagnosis not present

## 2019-09-11 DIAGNOSIS — Z23 Encounter for immunization: Secondary | ICD-10-CM | POA: Diagnosis not present

## 2019-09-26 DIAGNOSIS — H53143 Visual discomfort, bilateral: Secondary | ICD-10-CM | POA: Diagnosis not present

## 2019-09-26 DIAGNOSIS — H401123 Primary open-angle glaucoma, left eye, severe stage: Secondary | ICD-10-CM | POA: Diagnosis not present

## 2019-09-26 DIAGNOSIS — H401113 Primary open-angle glaucoma, right eye, severe stage: Secondary | ICD-10-CM | POA: Diagnosis not present

## 2019-10-16 DIAGNOSIS — G5602 Carpal tunnel syndrome, left upper limb: Secondary | ICD-10-CM | POA: Diagnosis not present

## 2019-10-29 DIAGNOSIS — E119 Type 2 diabetes mellitus without complications: Secondary | ICD-10-CM | POA: Diagnosis not present

## 2019-10-29 DIAGNOSIS — M79642 Pain in left hand: Secondary | ICD-10-CM | POA: Diagnosis not present

## 2019-10-29 DIAGNOSIS — G5602 Carpal tunnel syndrome, left upper limb: Secondary | ICD-10-CM | POA: Diagnosis not present

## 2019-10-29 DIAGNOSIS — M79641 Pain in right hand: Secondary | ICD-10-CM | POA: Diagnosis not present

## 2019-10-29 DIAGNOSIS — G5601 Carpal tunnel syndrome, right upper limb: Secondary | ICD-10-CM | POA: Diagnosis not present

## 2019-11-20 DIAGNOSIS — E1142 Type 2 diabetes mellitus with diabetic polyneuropathy: Secondary | ICD-10-CM | POA: Diagnosis not present

## 2019-11-20 DIAGNOSIS — G5602 Carpal tunnel syndrome, left upper limb: Secondary | ICD-10-CM | POA: Diagnosis not present

## 2019-11-20 DIAGNOSIS — M5412 Radiculopathy, cervical region: Secondary | ICD-10-CM | POA: Diagnosis not present

## 2019-12-02 DIAGNOSIS — H53143 Visual discomfort, bilateral: Secondary | ICD-10-CM | POA: Diagnosis not present

## 2019-12-02 DIAGNOSIS — H04123 Dry eye syndrome of bilateral lacrimal glands: Secondary | ICD-10-CM | POA: Diagnosis not present

## 2019-12-02 DIAGNOSIS — H0288B Meibomian gland dysfunction left eye, upper and lower eyelids: Secondary | ICD-10-CM | POA: Diagnosis not present

## 2019-12-02 DIAGNOSIS — H0288A Meibomian gland dysfunction right eye, upper and lower eyelids: Secondary | ICD-10-CM | POA: Diagnosis not present

## 2019-12-02 DIAGNOSIS — L718 Other rosacea: Secondary | ICD-10-CM | POA: Diagnosis not present

## 2019-12-08 DIAGNOSIS — M5412 Radiculopathy, cervical region: Secondary | ICD-10-CM | POA: Diagnosis not present

## 2020-01-28 DIAGNOSIS — H35373 Puckering of macula, bilateral: Secondary | ICD-10-CM | POA: Diagnosis not present

## 2020-01-28 DIAGNOSIS — H53143 Visual discomfort, bilateral: Secondary | ICD-10-CM | POA: Diagnosis not present

## 2020-01-28 DIAGNOSIS — H401123 Primary open-angle glaucoma, left eye, severe stage: Secondary | ICD-10-CM | POA: Diagnosis not present

## 2020-01-28 DIAGNOSIS — H401113 Primary open-angle glaucoma, right eye, severe stage: Secondary | ICD-10-CM | POA: Diagnosis not present

## 2020-01-28 DIAGNOSIS — H0288A Meibomian gland dysfunction right eye, upper and lower eyelids: Secondary | ICD-10-CM | POA: Diagnosis not present

## 2020-01-28 DIAGNOSIS — H0288B Meibomian gland dysfunction left eye, upper and lower eyelids: Secondary | ICD-10-CM | POA: Diagnosis not present

## 2020-02-13 DIAGNOSIS — R200 Anesthesia of skin: Secondary | ICD-10-CM | POA: Diagnosis not present

## 2020-02-13 DIAGNOSIS — M5412 Radiculopathy, cervical region: Secondary | ICD-10-CM | POA: Diagnosis not present

## 2020-02-20 DIAGNOSIS — H0288B Meibomian gland dysfunction left eye, upper and lower eyelids: Secondary | ICD-10-CM | POA: Diagnosis not present

## 2020-02-20 DIAGNOSIS — L718 Other rosacea: Secondary | ICD-10-CM | POA: Diagnosis not present

## 2020-02-20 DIAGNOSIS — H53143 Visual discomfort, bilateral: Secondary | ICD-10-CM | POA: Diagnosis not present

## 2020-02-20 DIAGNOSIS — H179 Unspecified corneal scar and opacity: Secondary | ICD-10-CM | POA: Diagnosis not present

## 2020-02-20 DIAGNOSIS — H0288A Meibomian gland dysfunction right eye, upper and lower eyelids: Secondary | ICD-10-CM | POA: Diagnosis not present

## 2020-02-20 DIAGNOSIS — H52213 Irregular astigmatism, bilateral: Secondary | ICD-10-CM | POA: Diagnosis not present

## 2020-02-20 DIAGNOSIS — H0102A Squamous blepharitis right eye, upper and lower eyelids: Secondary | ICD-10-CM | POA: Diagnosis not present

## 2020-02-20 DIAGNOSIS — H0102B Squamous blepharitis left eye, upper and lower eyelids: Secondary | ICD-10-CM | POA: Diagnosis not present

## 2020-02-20 DIAGNOSIS — H04123 Dry eye syndrome of bilateral lacrimal glands: Secondary | ICD-10-CM | POA: Diagnosis not present

## 2020-03-03 DIAGNOSIS — Z125 Encounter for screening for malignant neoplasm of prostate: Secondary | ICD-10-CM | POA: Diagnosis not present

## 2020-03-03 DIAGNOSIS — I700 Atherosclerosis of aorta: Secondary | ICD-10-CM | POA: Diagnosis not present

## 2020-03-03 DIAGNOSIS — I10 Essential (primary) hypertension: Secondary | ICD-10-CM | POA: Diagnosis not present

## 2020-03-03 DIAGNOSIS — E1121 Type 2 diabetes mellitus with diabetic nephropathy: Secondary | ICD-10-CM | POA: Diagnosis not present

## 2020-03-03 DIAGNOSIS — G5602 Carpal tunnel syndrome, left upper limb: Secondary | ICD-10-CM | POA: Diagnosis not present

## 2020-03-09 ENCOUNTER — Encounter (INDEPENDENT_AMBULATORY_CARE_PROVIDER_SITE_OTHER): Payer: Self-pay | Admitting: *Deleted

## 2020-05-14 DIAGNOSIS — H0288A Meibomian gland dysfunction right eye, upper and lower eyelids: Secondary | ICD-10-CM | POA: Diagnosis not present

## 2020-05-14 DIAGNOSIS — H0288B Meibomian gland dysfunction left eye, upper and lower eyelids: Secondary | ICD-10-CM | POA: Diagnosis not present

## 2020-05-14 DIAGNOSIS — H04123 Dry eye syndrome of bilateral lacrimal glands: Secondary | ICD-10-CM | POA: Diagnosis not present

## 2020-05-14 DIAGNOSIS — H401123 Primary open-angle glaucoma, left eye, severe stage: Secondary | ICD-10-CM | POA: Diagnosis not present

## 2020-05-14 DIAGNOSIS — L718 Other rosacea: Secondary | ICD-10-CM | POA: Diagnosis not present

## 2020-05-14 DIAGNOSIS — H53143 Visual discomfort, bilateral: Secondary | ICD-10-CM | POA: Diagnosis not present

## 2020-05-14 DIAGNOSIS — H179 Unspecified corneal scar and opacity: Secondary | ICD-10-CM | POA: Diagnosis not present

## 2020-05-14 DIAGNOSIS — H401113 Primary open-angle glaucoma, right eye, severe stage: Secondary | ICD-10-CM | POA: Diagnosis not present

## 2020-06-21 DIAGNOSIS — Z23 Encounter for immunization: Secondary | ICD-10-CM | POA: Diagnosis not present

## 2020-07-13 DIAGNOSIS — I700 Atherosclerosis of aorta: Secondary | ICD-10-CM | POA: Diagnosis not present

## 2020-07-13 DIAGNOSIS — I10 Essential (primary) hypertension: Secondary | ICD-10-CM | POA: Diagnosis not present

## 2020-07-13 DIAGNOSIS — G5602 Carpal tunnel syndrome, left upper limb: Secondary | ICD-10-CM | POA: Diagnosis not present

## 2020-07-13 DIAGNOSIS — E1121 Type 2 diabetes mellitus with diabetic nephropathy: Secondary | ICD-10-CM | POA: Diagnosis not present

## 2020-07-29 ENCOUNTER — Encounter: Payer: Self-pay | Admitting: *Deleted

## 2020-07-29 DIAGNOSIS — L718 Other rosacea: Secondary | ICD-10-CM | POA: Diagnosis not present

## 2020-07-29 DIAGNOSIS — H0288B Meibomian gland dysfunction left eye, upper and lower eyelids: Secondary | ICD-10-CM | POA: Diagnosis not present

## 2020-07-29 DIAGNOSIS — H179 Unspecified corneal scar and opacity: Secondary | ICD-10-CM | POA: Diagnosis not present

## 2020-07-29 DIAGNOSIS — H401123 Primary open-angle glaucoma, left eye, severe stage: Secondary | ICD-10-CM | POA: Diagnosis not present

## 2020-07-29 DIAGNOSIS — H401113 Primary open-angle glaucoma, right eye, severe stage: Secondary | ICD-10-CM | POA: Diagnosis not present

## 2020-08-17 ENCOUNTER — Other Ambulatory Visit: Payer: Self-pay

## 2020-08-17 ENCOUNTER — Ambulatory Visit (INDEPENDENT_AMBULATORY_CARE_PROVIDER_SITE_OTHER): Payer: Self-pay | Admitting: *Deleted

## 2020-08-17 VITALS — Ht 68.0 in | Wt 170.0 lb

## 2020-08-17 DIAGNOSIS — Z86010 Personal history of colonic polyps: Secondary | ICD-10-CM

## 2020-08-17 MED ORDER — PEG-KCL-NACL-NASULF-NA ASC-C 100 G PO SOLR: 1.0000 | Freq: Once | ORAL | 0 refills | Status: AC

## 2020-08-17 NOTE — Progress Notes (Signed)
Gastroenterology Pre-Procedure Review  Request Date: 08/17/2020 Requesting Physician: Dr. Sherrie Sport, Last TCS 03/10/2014 done by Dr. Gala Romney, tubular adenoma, hyperplastic polyp, hx of colonic polyp, family hx of colon cancer  PATIENT REVIEW QUESTIONS: The patient responded to the following health history questions as indicated:    1. Diabetes Melitis: yes, type II  2. Joint replacements in the past 12 months: no 3. Major health problems in the past 3 months: no 4. Has an artificial valve or MVP: no 5. Has a defibrillator: no 6. Has been advised in past to take antibiotics in advance of a procedure like teeth cleaning: no 7. Family history of colon cancer: yes, brother: age late 69's  8. Alcohol Use: no 9. Illicit drug Use: no 10. History of sleep apnea: no  11. History of coronary artery or other vascular stents placed within the last 12 months: no 12. History of any prior anesthesia complications: no 13. Body mass index is 25.85 kg/m.    MEDICATIONS & ALLERGIES:    Patient reports the following regarding taking any blood thinners:   Plavix? no Aspirin? yes Coumadin? no Brilinta? no Xarelto? no Eliquis? no Pradaxa? no Savaysa? no Effient? no  Patient confirms/reports the following medications:  Current Outpatient Medications  Medication Sig Dispense Refill  . aspirin EC 81 MG tablet Take 1 tablet (81 mg total) by mouth daily. 90 tablet 3  . dorzolamide (TRUSOPT) 2 % ophthalmic solution 1 drop 2 (two) times daily.    Marland Kitchen gabapentin (NEURONTIN) 300 MG capsule Take 300 mg by mouth as needed.    . Latanoprost 0.005 % EMUL Apply to eye daily.    Marland Kitchen levothyroxine (SYNTHROID) 100 MCG tablet Take 100 mcg by mouth daily before breakfast.    . metFORMIN (GLUCOPHAGE-XR) 500 MG 24 hr tablet Take 500 mg by mouth 2 (two) times daily.    . metoprolol succinate (TOPROL-XL) 25 MG 24 hr tablet Take 1 tablet by mouth daily.    Marland Kitchen olmesartan (BENICAR) 20 MG tablet Take 20 mg by mouth daily.     No  current facility-administered medications for this visit.    Patient confirms/reports the following allergies:  Allergies  Allergen Reactions  . Shellfish Allergy Swelling    Swelling of the eyes    No orders of the defined types were placed in this encounter.   AUTHORIZATION INFORMATION Primary Insurance: [employer] Medicare,  ID #: [id number],  Group #: [id number] Pre-Cert / Josem Kaufmann required: No, not required  SCHEDULE INFORMATION: Procedure has been scheduled as follows:  Date: 10/06/2020, Time: AM procedure Location: [facility] with Dr. Gala Romney  This Gastroenterology Pre-Precedure Review Form is being routed to the following provider(s): Roseanne Kaufman, NP

## 2020-08-17 NOTE — Progress Notes (Signed)
Pt says that he plans on going off of gabapentin next week.  He said that it was a trial basis to see if it would help with nerve pain in eye.  He said he doesn't take it much anyway due to it making him sleepy.  He also remembered that he takes fish oil.  Added to medication list.

## 2020-08-17 NOTE — Patient Instructions (Addendum)
Remember Covid test: 10/04/2020 at 3:45 PM.  [place]  Please notify us immediately if you are diabetic, take iron supplements, or if you are on coumadin or any blood thinners.  Patient Name:  Diarra Kos Date of procedure:  10/06/2020 Time to register at [facility]:   You will receive a call from the hospital a few days before your procedure. Provider:  Dr. Lazarus Gowda will need to purchase 1 fleet enema   Please hold the following medications: See letter  10/05/2020-  1 Day prior to procedure:     CLEAR LIQUIDS ALL DAY--NO SOLID FOODS!  Diabetic Medication Instructions:  See letter   At 7:00 AM Begin the prep as follows:     Empty 1 pouch A and 1 pouch B into disposable container.  Add lukewarm drinking water to the top line of the container.  Mix to dissolve.  You can mix solution ahead of time & refrigerate prior to drinking.  The solution should be used within 24 hours.  The container is divided by 4 marks.  Every 15 minutes, drink the solution down to the next mark (approx 8 oz) until the liter is complete.  Be sure to drink 16 ounces of clear liquid of your choice (this is important to make sure you stay hydrated and the prep works)    At 5:00 PM Begin the prep as follows:     Empty 1 pouch A and 1 pouch B into disposable container.  Add lukewarm drinking water to the top line of the container.  Mix to dissolve.  You can mix solution ahead of time & refrigerate prior to drinking.  The solution should be used within 24 hours.  The container is divided by 4 marks.  Every 15 minutes, drink the solution down to the next mark (approx 8 oz) until the liter is complete.  Be sure to drink 16 ounces of clear liquid of your choice (this is important to make sure you stay hydrated and the prep works)  Should complete within 1 and 1/2 hours.   CONTINUE CLEAR LIQUIDS UNTIL MIDNIGHT.  NOTHING BY MOUTH AFTER MIDNIGHT.  (NO SOLID FOODS UNTIL AFTER YOUR  COLONOSCOPY).  10/06/2020- Day of Procedure   You may take heart, blood pressure, or breathing medications.   Diabetic medications adjustments for today: See letter   Give yourself one Fleet enema about 1 hour prior to leaving for the hospital.   Please note, on the day of your procedure you MUST be accompanied by an adult who is willing to assume responsibility for you at time of discharge. If you do not have such person with you, your procedure will have to be rescheduled.                                                                                                                     Please leave ALL jewelry at home prior to coming to the hospital for your procedure.   *It  is your responsibility to check with your insurance company for the benefits of coverage you have for this procedure. Unfortunately, not all insurance companies have benefits to cover all or part of these types of procedures. It is your responsibility to check your benefits, however we will be glad to assist you with any codes your insurance company may need.   Please note that most insurance companies will not cover a screening colonoscopy for people under the age of 82  For example, with some insurance companies you may have benefits for a screening colonoscopy, but if polyps are found the diagnosis will change and then you may have a deductible that will need to be met. Please make sure you check your benefits for screening colonoscopy as well as a diagnostic colonoscopy.   CLEAR LIQUIDS: (NO RED) Jello Apple Juice  White Grape Juice Water Banana popsicles  Kool-Aid  Coffee(No cream or milk) Tea (No cream or milk) Soft drinks Broth (fat free beef/chicken/vegetable)  Clear liquids allow you to see your fingers on the other side of the glass.  Be sure they are NOT RED in color, cloudy, but CLEAR.  Do Not Eat: Dairy products of any kind Cranberry juice Tomato or V8 Juice  Orange Juice Grapefruit Juice  Red  Grape Juice Solid foods like cereal, oatmeal, yogurt, fruits, vegetables, creamed soups, eggs, bread, etc   HELPFUL HINTS TO MAKE DRINKING EASIER: -Make sure prep is extremely COLD.  Refrigerate the night before.  You may also put in freezer. -You may try mixing Crystal Light or Country Time Lemonade if you prefer.  MIx in small amounts.  Add more if necessary. -Trying drinking through a straw. -Rinse mouth with water or mouthwash between glasses to remove aftertaste. -Try sipping on a cold beverage/ice popsicles between glasses of prep. -Place a piece of sugar-free hard candy in mouth between glasses. -If you become nauseated, try consuming smaller amounts or stretch out the time between glasses.  Stop for 30 minutes to an hour & slowly start back drinking.  Call our office with any questions or concerns at [phone].  Thank You,  Christ Kick, [place]

## 2020-08-18 ENCOUNTER — Encounter: Payer: Self-pay | Admitting: *Deleted

## 2020-08-18 NOTE — Progress Notes (Signed)
ASA 2, appropriate, no metformin day of procedure.

## 2020-08-18 NOTE — Progress Notes (Signed)
Mailed letter to pt with diabetes medication adjustments.   

## 2020-08-26 DIAGNOSIS — H179 Unspecified corneal scar and opacity: Secondary | ICD-10-CM | POA: Diagnosis not present

## 2020-08-26 DIAGNOSIS — L718 Other rosacea: Secondary | ICD-10-CM | POA: Diagnosis not present

## 2020-08-26 DIAGNOSIS — H401123 Primary open-angle glaucoma, left eye, severe stage: Secondary | ICD-10-CM | POA: Diagnosis not present

## 2020-08-26 DIAGNOSIS — H401113 Primary open-angle glaucoma, right eye, severe stage: Secondary | ICD-10-CM | POA: Diagnosis not present

## 2020-08-26 DIAGNOSIS — H0288B Meibomian gland dysfunction left eye, upper and lower eyelids: Secondary | ICD-10-CM | POA: Diagnosis not present

## 2020-08-26 DIAGNOSIS — H53143 Visual discomfort, bilateral: Secondary | ICD-10-CM | POA: Diagnosis not present

## 2020-09-13 DIAGNOSIS — Z961 Presence of intraocular lens: Secondary | ICD-10-CM | POA: Diagnosis not present

## 2020-09-13 DIAGNOSIS — H401113 Primary open-angle glaucoma, right eye, severe stage: Secondary | ICD-10-CM | POA: Diagnosis not present

## 2020-09-13 DIAGNOSIS — H53143 Visual discomfort, bilateral: Secondary | ICD-10-CM | POA: Diagnosis not present

## 2020-09-13 DIAGNOSIS — H401123 Primary open-angle glaucoma, left eye, severe stage: Secondary | ICD-10-CM | POA: Diagnosis not present

## 2020-10-01 DIAGNOSIS — Z01812 Encounter for preprocedural laboratory examination: Secondary | ICD-10-CM | POA: Insufficient documentation

## 2020-10-01 DIAGNOSIS — Z20822 Contact with and (suspected) exposure to covid-19: Secondary | ICD-10-CM | POA: Insufficient documentation

## 2020-10-04 ENCOUNTER — Encounter (HOSPITAL_COMMUNITY)
Admission: RE | Admit: 2020-10-04 | Discharge: 2020-10-04 | Disposition: A | Discharge status: Home / Self Care | Payer: Medicare Other | Source: Ambulatory Visit | Attending: Internal Medicine | Admitting: Internal Medicine

## 2020-10-04 ENCOUNTER — Other Ambulatory Visit: Payer: Self-pay

## 2020-10-04 DIAGNOSIS — D239 Other benign neoplasm of skin, unspecified: Secondary | ICD-10-CM | POA: Diagnosis not present

## 2020-10-04 DIAGNOSIS — Z01812 Encounter for preprocedural laboratory examination: Secondary | ICD-10-CM | POA: Diagnosis not present

## 2020-10-04 DIAGNOSIS — L570 Actinic keratosis: Secondary | ICD-10-CM | POA: Diagnosis not present

## 2020-10-04 DIAGNOSIS — Z20822 Contact with and (suspected) exposure to covid-19: Secondary | ICD-10-CM | POA: Diagnosis not present

## 2020-10-05 LAB — SARS CORONAVIRUS 2 (TAT 6-24 HRS): SARS Coronavirus 2: NEGATIVE

## 2020-10-06 ENCOUNTER — Ambulatory Visit (HOSPITAL_COMMUNITY)
Admission: RE | Admit: 2020-10-06 | Discharge: 2020-10-06 | Disposition: A | Discharge status: Home / Self Care | Payer: Medicare Other | Source: Ambulatory Visit | Attending: Internal Medicine | Admitting: Internal Medicine

## 2020-10-06 ENCOUNTER — Telehealth: Payer: Self-pay | Admitting: *Deleted

## 2020-10-06 ENCOUNTER — Other Ambulatory Visit: Payer: Self-pay

## 2020-10-06 ENCOUNTER — Encounter (HOSPITAL_COMMUNITY)
Admission: RE | Disposition: A | Discharge status: Home / Self Care | Payer: Self-pay | Source: Ambulatory Visit | Attending: Internal Medicine

## 2020-10-06 ENCOUNTER — Encounter (HOSPITAL_COMMUNITY): Payer: Self-pay | Admitting: Internal Medicine

## 2020-10-06 DIAGNOSIS — Z7984 Long term (current) use of oral hypoglycemic drugs: Secondary | ICD-10-CM | POA: Insufficient documentation

## 2020-10-06 DIAGNOSIS — Z87891 Personal history of nicotine dependence: Secondary | ICD-10-CM | POA: Insufficient documentation

## 2020-10-06 DIAGNOSIS — Z7982 Long term (current) use of aspirin: Secondary | ICD-10-CM | POA: Insufficient documentation

## 2020-10-06 DIAGNOSIS — Z79899 Other long term (current) drug therapy: Secondary | ICD-10-CM | POA: Insufficient documentation

## 2020-10-06 DIAGNOSIS — Z79890 Hormone replacement therapy (postmenopausal): Secondary | ICD-10-CM | POA: Insufficient documentation

## 2020-10-06 DIAGNOSIS — Z91013 Allergy to seafood: Secondary | ICD-10-CM | POA: Diagnosis not present

## 2020-10-06 DIAGNOSIS — K644 Residual hemorrhoidal skin tags: Secondary | ICD-10-CM | POA: Diagnosis not present

## 2020-10-06 DIAGNOSIS — Z1211 Encounter for screening for malignant neoplasm of colon: Secondary | ICD-10-CM | POA: Insufficient documentation

## 2020-10-06 DIAGNOSIS — I10 Essential (primary) hypertension: Secondary | ICD-10-CM | POA: Diagnosis not present

## 2020-10-06 DIAGNOSIS — Z86010 Personal history of colonic polyps: Secondary | ICD-10-CM | POA: Diagnosis not present

## 2020-10-06 DIAGNOSIS — K640 First degree hemorrhoids: Secondary | ICD-10-CM | POA: Insufficient documentation

## 2020-10-06 DIAGNOSIS — Z800 Family history of malignant neoplasm of digestive organs: Secondary | ICD-10-CM | POA: Insufficient documentation

## 2020-10-06 DIAGNOSIS — E119 Type 2 diabetes mellitus without complications: Secondary | ICD-10-CM | POA: Diagnosis not present

## 2020-10-06 HISTORY — PX: COLONOSCOPY: SHX5424

## 2020-10-06 LAB — GLUCOSE, CAPILLARY: Glucose-Capillary: 133 mg/dL — ABNORMAL HIGH (ref 70–99)

## 2020-10-06 SURGERY — COLONOSCOPY
Anesthesia: Moderate Sedation

## 2020-10-06 MED ORDER — MEPERIDINE HCL 50 MG/ML IJ SOLN
INTRAMUSCULAR | Status: AC
  Filled 2020-10-06: qty 1

## 2020-10-06 MED ORDER — SODIUM CHLORIDE 0.9 % IV SOLN: INTRAVENOUS | Status: DC

## 2020-10-06 MED ORDER — MIDAZOLAM HCL 5 MG/5ML IJ SOLN
INTRAMUSCULAR | Status: AC
  Filled 2020-10-06: qty 10

## 2020-10-06 MED ORDER — ONDANSETRON HCL 4 MG/2ML IJ SOLN
INTRAMUSCULAR | Status: AC
  Filled 2020-10-06: qty 2

## 2020-10-06 MED ORDER — ONDANSETRON HCL 4 MG/2ML IJ SOLN
INTRAMUSCULAR | Status: DC | PRN
  Administered 2020-10-06: 4 mg via INTRAVENOUS

## 2020-10-06 MED ORDER — MIDAZOLAM HCL 5 MG/5ML IJ SOLN
INTRAMUSCULAR | Status: DC | PRN
  Administered 2020-10-06: 1 mg via INTRAVENOUS
  Administered 2020-10-06 (×3): 2 mg via INTRAVENOUS

## 2020-10-06 MED ORDER — STERILE WATER FOR IRRIGATION IR SOLN
Status: DC | PRN
  Administered 2020-10-06: 200 mL

## 2020-10-06 MED ORDER — MEPERIDINE HCL 100 MG/ML IJ SOLN
INTRAMUSCULAR | Status: DC | PRN
  Administered 2020-10-06: 25 mg via INTRAVENOUS
  Administered 2020-10-06: 15 mg via INTRAVENOUS

## 2020-10-06 NOTE — Op Note (Signed)
[facility] Patient Name: Micheal Young Procedure Date: 10/06/2020 7:10 AM MRN: [id number] Date of Birth: [birth year] Attending MD: Norvel Richards , MD CSN: [id number] Age: 70 Admit Type: Outpatient Procedure:                Colonoscopy Indications:              High risk colon cancer surveillance: Personal                            history of colonic polyps Providers:                Norvel Richards, MD, Rockleigh Page, [place]                            Risa Grill, Technician Referring MD:              Medicines:                Midazolam 7 mg IV, Meperidine 40 mg IV Complications:            No immediate complications. Estimated Blood Loss:     Estimated blood loss: none. Procedure:                Pre-Anesthesia Assessment:                           - Prior to the procedure, a History and Physical                            was performed, and patient medications and                            allergies were reviewed. The patient's tolerance of                            previous anesthesia was also reviewed. The risks                            and benefits of the procedure and the sedation                            options and risks were discussed with the patient.                            All questions were answered, and informed consent                            was obtained. Prior Anticoagulants: The patient has                            taken no previous anticoagulant or antiplatelet                            agents. ASA Grade Assessment: II - A patient with  mild systemic disease. After reviewing the risks                            and benefits, the patient was deemed in                            satisfactory condition to undergo the procedure.                           After obtaining informed consent, the colonoscope                            was passed under direct vision. Throughout the                            procedure,  the patient's blood pressure, pulse, and                            oxygen saturations were monitored continuously. The                            CF-[id number] ([id number]) scope was introduced through                            the anus and advanced to the the cecum, identified                            by appendiceal orifice and ileocecal valve. The                            colonoscopy was performed without difficulty. The                            patient tolerated the procedure well. The quality                            of the bowel preparation was adequate. Scope In: 7:53:52 AM Scope Out: 8:10:33 AM Scope Withdrawal Time: 0 hours 6 minutes 13 seconds  Total Procedure Duration: 0 hours 16 minutes 41 seconds  Findings:      The perianal and digital rectal examinations were normal.      Non-bleeding internal hemorrhoids were found during retroflexion. The       hemorrhoids were moderate, medium-sized and Grade I (internal       hemorrhoids that do not prolapse). Also, anal papilla present.      The exam was otherwise without abnormality on direct and retroflexion       views. Impression:               - Non-bleeding internal hemorrhoids.                           - The examination was otherwise normal on direct                            and retroflexion views.                           -  No specimens collected. Moderate Sedation:      Moderate (conscious) sedation was administered by the endoscopy nurse       and supervised by the endoscopist. The following parameters were       monitored: oxygen saturation, heart rate, blood pressure, respiratory       rate, EKG, adequacy of pulmonary ventilation, and response to care.       Total physician intraservice time was 27 minutes. Recommendation:           - Patient has a contact number available for                            emergencies. The signs and symptoms of potential                            delayed complications were discussed  with the                            patient. Return to normal activities tomorrow.                            Written discharge instructions were provided to the                            patient.                           - Resume previous diet.                           - Continue present medications.                           - Repeat colonoscopy in 5 years for surveillance.                           - Return to GI office (date not yet determined). Procedure Code(s):        --- Professional ---                           [phone], Colonoscopy, flexible; diagnostic, including                            collection of specimen(s) by brushing or washing,                            when performed (separate procedure)                           99153, Moderate sedation; each additional 15                            minutes intraservice time                           G0500, Moderate sedation services provided by the  same physician or other qualified health care                            professional performing a gastrointestinal                            endoscopic service that sedation supports,                            requiring the presence of an independent trained                            observer to assist in the monitoring of the                            patient's level of consciousness and physiological                            status; initial 15 minutes of intra-service time;                            patient age 25 years or older (additional time may                            be reported with [phone], as appropriate) Diagnosis Code(s):        --- Professional ---                           Z86.010, Personal history of colonic polyps                           K64.0, First degree hemorrhoids CPT copyright 2019 American [employer] Association. All rights reserved. The codes documented in this report are preliminary and upon coder review may  be revised to  meet current compliance requirements. Cristopher Estimable. Drey Shaff, MD Norvel Richards, MD 10/06/2020 8:14:08 AM This report has been signed electronically. Number of Addenda: 0

## 2020-10-06 NOTE — Discharge Instructions (Signed)
  Colonoscopy Discharge Instructions  Read the instructions outlined below and refer to this sheet in the next few weeks. These discharge instructions provide you with general information on caring for yourself after you leave the hospital. Your doctor may also give you specific instructions. While your treatment has been planned according to the most current medical practices available, unavoidable complications occasionally occur. If you have any problems or questions after discharge, call Dr. Gala Romney at [phone]. ACTIVITY  You may resume your regular activity, but move at a slower pace for the next 24 hours.   Take frequent rest periods for the next 24 hours.   Walking will help get rid of the air and reduce the bloated feeling in your belly (abdomen).   No driving for 24 hours (because of the medicine (anesthesia) used during the test).    Do not sign any important legal documents or operate any machinery for 24 hours (because of the anesthesia used during the test).  NUTRITION  Drink plenty of fluids.   You may resume your normal diet as instructed by your doctor.   Begin with a light meal and progress to your normal diet. Heavy or fried foods are harder to digest and may make you feel sick to your stomach (nauseated).   Avoid alcoholic beverages for 24 hours or as instructed.  MEDICATIONS  You may resume your normal medications unless your doctor tells you otherwise.  WHAT YOU CAN EXPECT TODAY  Some feelings of bloating in the abdomen.   Passage of more gas than usual.   Spotting of blood in your stool or on the toilet paper.  IF YOU HAD POLYPS REMOVED DURING THE COLONOSCOPY:  No aspirin products for 7 days or as instructed.   No alcohol for 7 days or as instructed.   Eat a soft diet for the next 24 hours.  FINDING OUT THE RESULTS OF YOUR TEST Not all test results are available during your visit. If your test results are not back during the visit, make an appointment  with your caregiver to find out the results. Do not assume everything is normal if you have not heard from your caregiver or the medical facility. It is important for you to follow up on all of your test results.  SEEK IMMEDIATE MEDICAL ATTENTION IF:  You have more than a spotting of blood in your stool.   Your belly is swollen (abdominal distention).   You are nauseated or vomiting.   You have a temperature over 101.   You have abdominal pain or discomfort that is severe or gets worse throughout the day.    No polyps found today.  Recommend 1 more surveillance colonoscopy in 5 years if overall health permits.  At patient request, I called Darcel Bayley at [phone] -reviewed results

## 2020-10-06 NOTE — H&P (Signed)
@LOGO @   Primary Care Physician:  Neale Burly, MD Primary Gastroenterologist:  Dr. Gala Romney  Pre-Procedure History & Physical: HPI:  Micheal Young is a 70 y.o. male here for surveillance colonoscopy.  History of colonic adenoma removed 2015; positive family history of colon cancer in a first-degree relative at a young age.  Past Medical History:  Diagnosis Date  . Essential hypertension, benign   . Hypothyroidism   . Mixed hyperlipidemia   . Tubular adenoma 10/04/09   Dr. Rowe Pavy  . Type 2 diabetes mellitus ([place])     Past Surgical History:  Procedure Laterality Date  . CHOLECYSTECTOMY    . COLONOSCOPY  10/04/09   Dr.Anwara sessile polyp was found in the rectum, no other abnormalities. bx= tubular adenoma  . COLONOSCOPY N/A 03/10/2014   Procedure: COLONOSCOPY;  Surgeon: Daneil Dolin, MD;  Location: [facility]ITE;  Service: Endoscopy;  Laterality: N/A;  12:45-rescheduled 9/8 to [place] notified pt  . VASECTOMY      Prior to Admission medications   Medication Sig Start Date End Date Taking? Authorizing Provider  aspirin EC 81 MG tablet Take 1 tablet (81 mg total) by mouth daily. 12/01/13  Yes Satira Sark, MD  dorzolamide-timolol (COSOPT) 22.3-6.8 MG/ML ophthalmic solution Place 1 drop into both eyes 2 (two) times daily. 09/21/20  Yes [provider]  Latanoprost 0.005 % EMUL Place 1 drop into both eyes at bedtime.   Yes [provider]  levothyroxine (SYNTHROID) 100 MCG tablet Take 100 mcg by mouth daily before breakfast.   Yes [provider]  metFORMIN (GLUCOPHAGE-XR) 500 MG 24 hr tablet Take 500 mg by mouth 2 (two) times daily. 07/09/13  Yes [provider]  metoprolol succinate (TOPROL-XL) 25 MG 24 hr tablet Take 25 mg by mouth daily. 10/06/13  Yes [provider]  olmesartan (BENICAR) 20 MG tablet Take 20 mg by mouth daily. 07/13/20  Yes [provider]  Omega-3 Fatty Acids (FISH OIL PO) Take 1 capsule by mouth 2 (two)  times daily.   Yes [provider]    Allergies as of 08/18/2020 - Review Complete 08/17/2020  Allergen Reaction Noted  . Shellfish allergy Swelling 10/05/2013    Family History  Problem Relation Age of Onset  . Colon cancer Brother     Social History   Socioeconomic History  . Marital status: Married    Spouse name: Not on file  . Number of children: Not on file  . Years of education: Not on file  . Highest education level: Not on file  Occupational History  . Not on file  Tobacco Use  . Smoking status: Former Smoker    Packs/day: 1.00    Years: 17.00    Pack years: 17.00    Types: Cigarettes    Start date: 07/10/1968    Quit date: 12/19/1985    Years since quitting: 34.8  . Smokeless tobacco: Never Used  . Tobacco comment: smoked pipe also  Vaping Use  . Vaping Use: Never used  Substance and Sexual Activity  . Alcohol use: No  . Drug use: No  . Sexual activity: Not on file  Other Topics Concern  . Not on file  Social History Narrative  . Not on file   Social Determinants of Health   Financial Resource Strain: Not on file  Food Insecurity: Not on file  Transportation Needs: Not on file  Physical Activity: Not on file  Stress: Not on file  Social  Connections: Not on file  Intimate Partner Violence: Not on file    Review of Systems: See HPI, otherwise negative ROS  Physical Exam: BP (!) 175/77   Pulse 69   Temp 98.4 F (36.9 C) (Oral)   Resp 16   Ht 5\' 8"  (1.727 m)   Wt 77.1 kg   SpO2 99%   BMI 25.85 kg/m  General:   Alert,  Well-developed, well-nourished, pleasant and cooperative in NAD Neck:  Supple; no masses or thyromegaly. No significant cervical adenopathy. Lungs:  Clear throughout to auscultation.   No wheezes, crackles, or rhonchi. No acute distress. Heart:  Regular rate and rhythm; no murmurs, clicks, rubs,  or gallops. Abdomen: Non-distended, normal bowel sounds.  Soft and nontender without appreciable mass or  hepatosplenomegaly.  Pulses:  Normal pulses noted. Extremities:  Without clubbing or edema.  Impression/Plan: 70 year old gentleman here for surveillance colonoscopy.  Personal history colonic polyps and a positive family history colon cancer in a first-degree relative at a young age. The risks, benefits, limitations, alternatives and imponderables have been reviewed with the patient. Questions have been answered. All parties are agreeable.      Notice: This dictation was prepared with Dragon dictation along with smaller phrase technology. Any transcriptional errors that result from this process are unintentional and may not be corrected upon review.

## 2020-10-11 ENCOUNTER — Encounter (HOSPITAL_COMMUNITY): Payer: Self-pay | Admitting: Internal Medicine

## 2020-11-02 DIAGNOSIS — H53143 Visual discomfort, bilateral: Secondary | ICD-10-CM | POA: Diagnosis not present

## 2020-11-02 DIAGNOSIS — H02889 Meibomian gland dysfunction of unspecified eye, unspecified eyelid: Secondary | ICD-10-CM | POA: Diagnosis not present

## 2020-11-02 DIAGNOSIS — H35371 Puckering of macula, right eye: Secondary | ICD-10-CM | POA: Diagnosis not present

## 2020-11-02 DIAGNOSIS — H401133 Primary open-angle glaucoma, bilateral, severe stage: Secondary | ICD-10-CM | POA: Diagnosis not present

## 2020-11-04 DIAGNOSIS — H40033 Anatomical narrow angle, bilateral: Secondary | ICD-10-CM | POA: Diagnosis not present

## 2020-11-04 DIAGNOSIS — H04123 Dry eye syndrome of bilateral lacrimal glands: Secondary | ICD-10-CM | POA: Diagnosis not present

## 2020-11-10 DIAGNOSIS — G5602 Carpal tunnel syndrome, left upper limb: Secondary | ICD-10-CM | POA: Diagnosis not present

## 2020-11-10 DIAGNOSIS — I700 Atherosclerosis of aorta: Secondary | ICD-10-CM | POA: Diagnosis not present

## 2020-11-10 DIAGNOSIS — I10 Essential (primary) hypertension: Secondary | ICD-10-CM | POA: Diagnosis not present

## 2020-11-10 DIAGNOSIS — E1121 Type 2 diabetes mellitus with diabetic nephropathy: Secondary | ICD-10-CM | POA: Diagnosis not present

## 2020-11-10 DIAGNOSIS — Z0000 Encounter for general adult medical examination without abnormal findings: Secondary | ICD-10-CM | POA: Diagnosis not present

## 2020-11-30 DIAGNOSIS — L570 Actinic keratosis: Secondary | ICD-10-CM | POA: Diagnosis not present

## 2020-12-07 DIAGNOSIS — H401133 Primary open-angle glaucoma, bilateral, severe stage: Secondary | ICD-10-CM | POA: Diagnosis not present

## 2021-01-25 DIAGNOSIS — H401133 Primary open-angle glaucoma, bilateral, severe stage: Secondary | ICD-10-CM | POA: Diagnosis not present

## 2021-01-25 DIAGNOSIS — H53143 Visual discomfort, bilateral: Secondary | ICD-10-CM | POA: Diagnosis not present

## 2021-01-25 DIAGNOSIS — H02889 Meibomian gland dysfunction of unspecified eye, unspecified eyelid: Secondary | ICD-10-CM | POA: Diagnosis not present

## 2021-01-25 DIAGNOSIS — H35371 Puckering of macula, right eye: Secondary | ICD-10-CM | POA: Diagnosis not present

## 2021-02-21 DIAGNOSIS — H0288B Meibomian gland dysfunction left eye, upper and lower eyelids: Secondary | ICD-10-CM | POA: Diagnosis not present

## 2021-02-21 DIAGNOSIS — H1811 Bullous keratopathy, right eye: Secondary | ICD-10-CM | POA: Diagnosis not present

## 2021-02-21 DIAGNOSIS — H401130 Primary open-angle glaucoma, bilateral, stage unspecified: Secondary | ICD-10-CM | POA: Diagnosis not present

## 2021-02-21 DIAGNOSIS — H0288A Meibomian gland dysfunction right eye, upper and lower eyelids: Secondary | ICD-10-CM | POA: Diagnosis not present

## 2021-02-21 DIAGNOSIS — Z961 Presence of intraocular lens: Secondary | ICD-10-CM | POA: Diagnosis not present

## 2021-02-21 DIAGNOSIS — L718 Other rosacea: Secondary | ICD-10-CM | POA: Diagnosis not present

## 2021-03-09 DIAGNOSIS — Z961 Presence of intraocular lens: Secondary | ICD-10-CM | POA: Diagnosis not present

## 2021-03-09 DIAGNOSIS — H0288B Meibomian gland dysfunction left eye, upper and lower eyelids: Secondary | ICD-10-CM | POA: Diagnosis not present

## 2021-03-09 DIAGNOSIS — H401130 Primary open-angle glaucoma, bilateral, stage unspecified: Secondary | ICD-10-CM | POA: Diagnosis not present

## 2021-03-09 DIAGNOSIS — H1811 Bullous keratopathy, right eye: Secondary | ICD-10-CM | POA: Diagnosis not present

## 2021-03-09 DIAGNOSIS — L718 Other rosacea: Secondary | ICD-10-CM | POA: Diagnosis not present

## 2021-03-09 DIAGNOSIS — H0288A Meibomian gland dysfunction right eye, upper and lower eyelids: Secondary | ICD-10-CM | POA: Diagnosis not present

## 2021-03-16 DIAGNOSIS — E1121 Type 2 diabetes mellitus with diabetic nephropathy: Secondary | ICD-10-CM | POA: Diagnosis not present

## 2021-03-16 DIAGNOSIS — I700 Atherosclerosis of aorta: Secondary | ICD-10-CM | POA: Diagnosis not present

## 2021-03-16 DIAGNOSIS — E7849 Other hyperlipidemia: Secondary | ICD-10-CM | POA: Diagnosis not present

## 2021-03-16 DIAGNOSIS — I10 Essential (primary) hypertension: Secondary | ICD-10-CM | POA: Diagnosis not present

## 2021-03-16 DIAGNOSIS — Z0000 Encounter for general adult medical examination without abnormal findings: Secondary | ICD-10-CM | POA: Diagnosis not present

## 2021-04-13 DIAGNOSIS — H1811 Bullous keratopathy, right eye: Secondary | ICD-10-CM | POA: Diagnosis not present

## 2021-04-13 DIAGNOSIS — H0288A Meibomian gland dysfunction right eye, upper and lower eyelids: Secondary | ICD-10-CM | POA: Diagnosis not present

## 2021-04-13 DIAGNOSIS — H401130 Primary open-angle glaucoma, bilateral, stage unspecified: Secondary | ICD-10-CM | POA: Diagnosis not present

## 2021-04-13 DIAGNOSIS — Z961 Presence of intraocular lens: Secondary | ICD-10-CM | POA: Diagnosis not present

## 2021-04-13 DIAGNOSIS — L718 Other rosacea: Secondary | ICD-10-CM | POA: Diagnosis not present

## 2021-04-13 DIAGNOSIS — H0288B Meibomian gland dysfunction left eye, upper and lower eyelids: Secondary | ICD-10-CM | POA: Diagnosis not present

## 2021-06-06 DIAGNOSIS — J329 Chronic sinusitis, unspecified: Secondary | ICD-10-CM | POA: Diagnosis not present

## 2021-06-16 DIAGNOSIS — H1811 Bullous keratopathy, right eye: Secondary | ICD-10-CM | POA: Diagnosis not present

## 2021-06-16 DIAGNOSIS — E119 Type 2 diabetes mellitus without complications: Secondary | ICD-10-CM | POA: Diagnosis not present

## 2021-06-16 DIAGNOSIS — Z7984 Long term (current) use of oral hypoglycemic drugs: Secondary | ICD-10-CM | POA: Diagnosis not present

## 2021-06-16 DIAGNOSIS — I10 Essential (primary) hypertension: Secondary | ICD-10-CM | POA: Diagnosis not present

## 2021-06-20 DIAGNOSIS — H0288B Meibomian gland dysfunction left eye, upper and lower eyelids: Secondary | ICD-10-CM | POA: Diagnosis not present

## 2021-06-20 DIAGNOSIS — H401130 Primary open-angle glaucoma, bilateral, stage unspecified: Secondary | ICD-10-CM | POA: Diagnosis not present

## 2021-06-20 DIAGNOSIS — H0288A Meibomian gland dysfunction right eye, upper and lower eyelids: Secondary | ICD-10-CM | POA: Diagnosis not present

## 2021-06-20 DIAGNOSIS — J40 Bronchitis, not specified as acute or chronic: Secondary | ICD-10-CM | POA: Diagnosis not present

## 2021-06-20 DIAGNOSIS — Z48810 Encounter for surgical aftercare following surgery on the sense organs: Secondary | ICD-10-CM | POA: Diagnosis not present

## 2021-06-20 DIAGNOSIS — Z7984 Long term (current) use of oral hypoglycemic drugs: Secondary | ICD-10-CM | POA: Diagnosis not present

## 2021-06-20 DIAGNOSIS — L718 Other rosacea: Secondary | ICD-10-CM | POA: Diagnosis not present

## 2021-06-20 DIAGNOSIS — E119 Type 2 diabetes mellitus without complications: Secondary | ICD-10-CM | POA: Diagnosis not present

## 2021-07-08 DIAGNOSIS — L718 Other rosacea: Secondary | ICD-10-CM | POA: Diagnosis not present

## 2021-07-08 DIAGNOSIS — Z947 Corneal transplant status: Secondary | ICD-10-CM | POA: Diagnosis not present

## 2021-07-08 DIAGNOSIS — H0288A Meibomian gland dysfunction right eye, upper and lower eyelids: Secondary | ICD-10-CM | POA: Diagnosis not present

## 2021-07-08 DIAGNOSIS — Z961 Presence of intraocular lens: Secondary | ICD-10-CM | POA: Diagnosis not present

## 2021-07-08 DIAGNOSIS — Z7984 Long term (current) use of oral hypoglycemic drugs: Secondary | ICD-10-CM | POA: Diagnosis not present

## 2021-07-08 DIAGNOSIS — H401133 Primary open-angle glaucoma, bilateral, severe stage: Secondary | ICD-10-CM | POA: Diagnosis not present

## 2021-07-08 DIAGNOSIS — Z48810 Encounter for surgical aftercare following surgery on the sense organs: Secondary | ICD-10-CM | POA: Diagnosis not present

## 2021-07-08 DIAGNOSIS — E119 Type 2 diabetes mellitus without complications: Secondary | ICD-10-CM | POA: Diagnosis not present

## 2021-07-08 DIAGNOSIS — H0288B Meibomian gland dysfunction left eye, upper and lower eyelids: Secondary | ICD-10-CM | POA: Diagnosis not present

## 2021-07-20 DIAGNOSIS — E119 Type 2 diabetes mellitus without complications: Secondary | ICD-10-CM | POA: Diagnosis not present

## 2021-07-20 DIAGNOSIS — J40 Bronchitis, not specified as acute or chronic: Secondary | ICD-10-CM | POA: Diagnosis not present

## 2021-07-20 DIAGNOSIS — J302 Other seasonal allergic rhinitis: Secondary | ICD-10-CM | POA: Diagnosis not present

## 2021-07-20 DIAGNOSIS — E1121 Type 2 diabetes mellitus with diabetic nephropathy: Secondary | ICD-10-CM | POA: Diagnosis not present

## 2021-07-20 DIAGNOSIS — I700 Atherosclerosis of aorta: Secondary | ICD-10-CM | POA: Diagnosis not present

## 2021-07-20 DIAGNOSIS — Z0000 Encounter for general adult medical examination without abnormal findings: Secondary | ICD-10-CM | POA: Diagnosis not present

## 2021-07-20 DIAGNOSIS — E038 Other specified hypothyroidism: Secondary | ICD-10-CM | POA: Diagnosis not present

## 2021-07-20 DIAGNOSIS — H409 Unspecified glaucoma: Secondary | ICD-10-CM | POA: Diagnosis not present

## 2021-07-20 DIAGNOSIS — E7849 Other hyperlipidemia: Secondary | ICD-10-CM | POA: Diagnosis not present

## 2021-10-17 DIAGNOSIS — H0288B Meibomian gland dysfunction left eye, upper and lower eyelids: Secondary | ICD-10-CM | POA: Diagnosis not present

## 2021-10-17 DIAGNOSIS — Z947 Corneal transplant status: Secondary | ICD-10-CM | POA: Diagnosis not present

## 2021-10-17 DIAGNOSIS — Z961 Presence of intraocular lens: Secondary | ICD-10-CM | POA: Diagnosis not present

## 2021-10-17 DIAGNOSIS — H0288A Meibomian gland dysfunction right eye, upper and lower eyelids: Secondary | ICD-10-CM | POA: Diagnosis not present

## 2021-10-17 DIAGNOSIS — L718 Other rosacea: Secondary | ICD-10-CM | POA: Diagnosis not present

## 2021-10-17 DIAGNOSIS — H401133 Primary open-angle glaucoma, bilateral, severe stage: Secondary | ICD-10-CM | POA: Diagnosis not present

## 2021-11-16 DIAGNOSIS — L718 Other rosacea: Secondary | ICD-10-CM | POA: Diagnosis not present

## 2021-11-16 DIAGNOSIS — H0288B Meibomian gland dysfunction left eye, upper and lower eyelids: Secondary | ICD-10-CM | POA: Diagnosis not present

## 2021-11-16 DIAGNOSIS — Z961 Presence of intraocular lens: Secondary | ICD-10-CM | POA: Diagnosis not present

## 2021-11-16 DIAGNOSIS — H401133 Primary open-angle glaucoma, bilateral, severe stage: Secondary | ICD-10-CM | POA: Diagnosis not present

## 2021-11-16 DIAGNOSIS — Z947 Corneal transplant status: Secondary | ICD-10-CM | POA: Diagnosis not present

## 2021-11-16 DIAGNOSIS — H0288A Meibomian gland dysfunction right eye, upper and lower eyelids: Secondary | ICD-10-CM | POA: Diagnosis not present

## 2021-11-17 DIAGNOSIS — G5602 Carpal tunnel syndrome, left upper limb: Secondary | ICD-10-CM | POA: Diagnosis not present

## 2021-11-17 DIAGNOSIS — E1121 Type 2 diabetes mellitus with diabetic nephropathy: Secondary | ICD-10-CM | POA: Diagnosis not present

## 2021-11-17 DIAGNOSIS — E038 Other specified hypothyroidism: Secondary | ICD-10-CM | POA: Diagnosis not present

## 2021-11-17 DIAGNOSIS — E1165 Type 2 diabetes mellitus with hyperglycemia: Secondary | ICD-10-CM | POA: Diagnosis not present

## 2021-11-17 DIAGNOSIS — I10 Essential (primary) hypertension: Secondary | ICD-10-CM | POA: Diagnosis not present

## 2021-11-17 DIAGNOSIS — H409 Unspecified glaucoma: Secondary | ICD-10-CM | POA: Diagnosis not present

## 2021-11-17 DIAGNOSIS — E7849 Other hyperlipidemia: Secondary | ICD-10-CM | POA: Diagnosis not present

## 2021-11-17 DIAGNOSIS — J302 Other seasonal allergic rhinitis: Secondary | ICD-10-CM | POA: Diagnosis not present

## 2021-11-17 DIAGNOSIS — I700 Atherosclerosis of aorta: Secondary | ICD-10-CM | POA: Diagnosis not present

## 2021-11-29 DIAGNOSIS — D044 Carcinoma in situ of skin of scalp and neck: Secondary | ICD-10-CM | POA: Diagnosis not present

## 2021-11-29 DIAGNOSIS — C4442 Squamous cell carcinoma of skin of scalp and neck: Secondary | ICD-10-CM | POA: Diagnosis not present

## 2021-12-01 DIAGNOSIS — H401133 Primary open-angle glaucoma, bilateral, severe stage: Secondary | ICD-10-CM | POA: Diagnosis not present

## 2021-12-01 DIAGNOSIS — Z947 Corneal transplant status: Secondary | ICD-10-CM | POA: Diagnosis not present

## 2021-12-01 DIAGNOSIS — L719 Rosacea, unspecified: Secondary | ICD-10-CM | POA: Diagnosis not present

## 2021-12-01 DIAGNOSIS — H35371 Puckering of macula, right eye: Secondary | ICD-10-CM | POA: Diagnosis not present

## 2021-12-01 DIAGNOSIS — H53143 Visual discomfort, bilateral: Secondary | ICD-10-CM | POA: Diagnosis not present

## 2021-12-08 DIAGNOSIS — C4442 Squamous cell carcinoma of skin of scalp and neck: Secondary | ICD-10-CM | POA: Diagnosis not present

## 2021-12-14 DIAGNOSIS — Z947 Corneal transplant status: Secondary | ICD-10-CM | POA: Diagnosis not present

## 2021-12-14 DIAGNOSIS — H0288A Meibomian gland dysfunction right eye, upper and lower eyelids: Secondary | ICD-10-CM | POA: Diagnosis not present

## 2021-12-14 DIAGNOSIS — L718 Other rosacea: Secondary | ICD-10-CM | POA: Diagnosis not present

## 2021-12-14 DIAGNOSIS — H0288B Meibomian gland dysfunction left eye, upper and lower eyelids: Secondary | ICD-10-CM | POA: Diagnosis not present

## 2021-12-14 DIAGNOSIS — Z961 Presence of intraocular lens: Secondary | ICD-10-CM | POA: Diagnosis not present

## 2021-12-14 DIAGNOSIS — H401133 Primary open-angle glaucoma, bilateral, severe stage: Secondary | ICD-10-CM | POA: Diagnosis not present

## 2022-02-06 DIAGNOSIS — L570 Actinic keratosis: Secondary | ICD-10-CM | POA: Diagnosis not present

## 2022-03-13 DIAGNOSIS — Z7984 Long term (current) use of oral hypoglycemic drugs: Secondary | ICD-10-CM | POA: Diagnosis not present

## 2022-03-13 DIAGNOSIS — E119 Type 2 diabetes mellitus without complications: Secondary | ICD-10-CM | POA: Diagnosis not present

## 2022-03-13 DIAGNOSIS — H0288B Meibomian gland dysfunction left eye, upper and lower eyelids: Secondary | ICD-10-CM | POA: Diagnosis not present

## 2022-03-13 DIAGNOSIS — Z947 Corneal transplant status: Secondary | ICD-10-CM | POA: Diagnosis not present

## 2022-03-13 DIAGNOSIS — Z961 Presence of intraocular lens: Secondary | ICD-10-CM | POA: Diagnosis not present

## 2022-03-13 DIAGNOSIS — H401133 Primary open-angle glaucoma, bilateral, severe stage: Secondary | ICD-10-CM | POA: Diagnosis not present

## 2022-03-13 DIAGNOSIS — L718 Other rosacea: Secondary | ICD-10-CM | POA: Diagnosis not present

## 2022-03-13 DIAGNOSIS — H0288A Meibomian gland dysfunction right eye, upper and lower eyelids: Secondary | ICD-10-CM | POA: Diagnosis not present

## 2022-03-21 DIAGNOSIS — Z0000 Encounter for general adult medical examination without abnormal findings: Secondary | ICD-10-CM | POA: Diagnosis not present

## 2022-03-21 DIAGNOSIS — J302 Other seasonal allergic rhinitis: Secondary | ICD-10-CM | POA: Diagnosis not present

## 2022-03-21 DIAGNOSIS — E038 Other specified hypothyroidism: Secondary | ICD-10-CM | POA: Diagnosis not present

## 2022-03-21 DIAGNOSIS — H409 Unspecified glaucoma: Secondary | ICD-10-CM | POA: Diagnosis not present

## 2022-03-21 DIAGNOSIS — E7849 Other hyperlipidemia: Secondary | ICD-10-CM | POA: Diagnosis not present

## 2022-03-21 DIAGNOSIS — G5602 Carpal tunnel syndrome, left upper limb: Secondary | ICD-10-CM | POA: Diagnosis not present

## 2022-03-21 DIAGNOSIS — I700 Atherosclerosis of aorta: Secondary | ICD-10-CM | POA: Diagnosis not present

## 2022-03-21 DIAGNOSIS — I10 Essential (primary) hypertension: Secondary | ICD-10-CM | POA: Diagnosis not present

## 2022-03-21 DIAGNOSIS — E1121 Type 2 diabetes mellitus with diabetic nephropathy: Secondary | ICD-10-CM | POA: Diagnosis not present

## 2022-03-23 DIAGNOSIS — L719 Rosacea, unspecified: Secondary | ICD-10-CM | POA: Diagnosis not present

## 2022-03-23 DIAGNOSIS — H401133 Primary open-angle glaucoma, bilateral, severe stage: Secondary | ICD-10-CM | POA: Diagnosis not present

## 2022-03-23 DIAGNOSIS — H53143 Visual discomfort, bilateral: Secondary | ICD-10-CM | POA: Diagnosis not present

## 2022-03-23 DIAGNOSIS — Z947 Corneal transplant status: Secondary | ICD-10-CM | POA: Diagnosis not present

## 2022-03-23 DIAGNOSIS — H35371 Puckering of macula, right eye: Secondary | ICD-10-CM | POA: Diagnosis not present

## 2022-03-27 DIAGNOSIS — L718 Other rosacea: Secondary | ICD-10-CM | POA: Diagnosis not present

## 2022-03-27 DIAGNOSIS — Z961 Presence of intraocular lens: Secondary | ICD-10-CM | POA: Diagnosis not present

## 2022-03-27 DIAGNOSIS — H0288A Meibomian gland dysfunction right eye, upper and lower eyelids: Secondary | ICD-10-CM | POA: Diagnosis not present

## 2022-03-27 DIAGNOSIS — H401133 Primary open-angle glaucoma, bilateral, severe stage: Secondary | ICD-10-CM | POA: Diagnosis not present

## 2022-03-27 DIAGNOSIS — Z947 Corneal transplant status: Secondary | ICD-10-CM | POA: Diagnosis not present

## 2022-03-27 DIAGNOSIS — H04129 Dry eye syndrome of unspecified lacrimal gland: Secondary | ICD-10-CM | POA: Diagnosis not present

## 2022-03-27 DIAGNOSIS — H0288B Meibomian gland dysfunction left eye, upper and lower eyelids: Secondary | ICD-10-CM | POA: Diagnosis not present

## 2022-04-12 DIAGNOSIS — L0390 Cellulitis, unspecified: Secondary | ICD-10-CM | POA: Diagnosis not present

## 2022-04-24 DIAGNOSIS — H401133 Primary open-angle glaucoma, bilateral, severe stage: Secondary | ICD-10-CM | POA: Diagnosis not present

## 2022-04-24 DIAGNOSIS — H04129 Dry eye syndrome of unspecified lacrimal gland: Secondary | ICD-10-CM | POA: Diagnosis not present

## 2022-04-24 DIAGNOSIS — H0288A Meibomian gland dysfunction right eye, upper and lower eyelids: Secondary | ICD-10-CM | POA: Diagnosis not present

## 2022-04-24 DIAGNOSIS — H0288B Meibomian gland dysfunction left eye, upper and lower eyelids: Secondary | ICD-10-CM | POA: Diagnosis not present

## 2022-04-24 DIAGNOSIS — L718 Other rosacea: Secondary | ICD-10-CM | POA: Diagnosis not present

## 2022-04-24 DIAGNOSIS — Z947 Corneal transplant status: Secondary | ICD-10-CM | POA: Diagnosis not present

## 2022-04-24 DIAGNOSIS — Z961 Presence of intraocular lens: Secondary | ICD-10-CM | POA: Diagnosis not present

## 2022-07-24 DIAGNOSIS — H35373 Puckering of macula, bilateral: Secondary | ICD-10-CM | POA: Diagnosis not present

## 2022-07-24 DIAGNOSIS — L719 Rosacea, unspecified: Secondary | ICD-10-CM | POA: Diagnosis not present

## 2022-07-24 DIAGNOSIS — H401133 Primary open-angle glaucoma, bilateral, severe stage: Secondary | ICD-10-CM | POA: Diagnosis not present

## 2022-07-24 DIAGNOSIS — Z947 Corneal transplant status: Secondary | ICD-10-CM | POA: Diagnosis not present

## 2022-07-24 DIAGNOSIS — H53143 Visual discomfort, bilateral: Secondary | ICD-10-CM | POA: Diagnosis not present

## 2022-07-26 DIAGNOSIS — E7849 Other hyperlipidemia: Secondary | ICD-10-CM | POA: Diagnosis not present

## 2022-07-26 DIAGNOSIS — E1121 Type 2 diabetes mellitus with diabetic nephropathy: Secondary | ICD-10-CM | POA: Diagnosis not present

## 2022-07-26 DIAGNOSIS — H409 Unspecified glaucoma: Secondary | ICD-10-CM | POA: Diagnosis not present

## 2022-07-26 DIAGNOSIS — N1831 Chronic kidney disease, stage 3a: Secondary | ICD-10-CM | POA: Diagnosis not present

## 2022-07-26 DIAGNOSIS — I700 Atherosclerosis of aorta: Secondary | ICD-10-CM | POA: Diagnosis not present

## 2022-07-26 DIAGNOSIS — Z0000 Encounter for general adult medical examination without abnormal findings: Secondary | ICD-10-CM | POA: Diagnosis not present

## 2022-07-26 DIAGNOSIS — L0390 Cellulitis, unspecified: Secondary | ICD-10-CM | POA: Diagnosis not present

## 2022-07-26 DIAGNOSIS — I10 Essential (primary) hypertension: Secondary | ICD-10-CM | POA: Diagnosis not present

## 2022-08-08 DIAGNOSIS — L570 Actinic keratosis: Secondary | ICD-10-CM | POA: Diagnosis not present

## 2022-08-08 DIAGNOSIS — L905 Scar conditions and fibrosis of skin: Secondary | ICD-10-CM | POA: Diagnosis not present

## 2022-08-08 DIAGNOSIS — D485 Neoplasm of uncertain behavior of skin: Secondary | ICD-10-CM | POA: Diagnosis not present

## 2022-08-23 DIAGNOSIS — H0288A Meibomian gland dysfunction right eye, upper and lower eyelids: Secondary | ICD-10-CM | POA: Diagnosis not present

## 2022-08-23 DIAGNOSIS — H401133 Primary open-angle glaucoma, bilateral, severe stage: Secondary | ICD-10-CM | POA: Diagnosis not present

## 2022-08-23 DIAGNOSIS — Z961 Presence of intraocular lens: Secondary | ICD-10-CM | POA: Diagnosis not present

## 2022-08-23 DIAGNOSIS — Z947 Corneal transplant status: Secondary | ICD-10-CM | POA: Diagnosis not present

## 2022-08-23 DIAGNOSIS — H0288B Meibomian gland dysfunction left eye, upper and lower eyelids: Secondary | ICD-10-CM | POA: Diagnosis not present

## 2022-08-23 DIAGNOSIS — L718 Other rosacea: Secondary | ICD-10-CM | POA: Diagnosis not present

## 2022-08-23 DIAGNOSIS — H04129 Dry eye syndrome of unspecified lacrimal gland: Secondary | ICD-10-CM | POA: Diagnosis not present

## 2022-10-17 DIAGNOSIS — T868419 Corneal transplant failure, unspecified eye: Secondary | ICD-10-CM | POA: Diagnosis not present

## 2022-10-17 DIAGNOSIS — T868411 Corneal transplant failure, right eye: Secondary | ICD-10-CM | POA: Diagnosis not present

## 2022-10-26 DIAGNOSIS — H1789 Other corneal scars and opacities: Secondary | ICD-10-CM | POA: Diagnosis not present

## 2022-10-26 DIAGNOSIS — H1811 Bullous keratopathy, right eye: Secondary | ICD-10-CM | POA: Diagnosis not present

## 2022-10-27 DIAGNOSIS — H0288B Meibomian gland dysfunction left eye, upper and lower eyelids: Secondary | ICD-10-CM | POA: Diagnosis not present

## 2022-10-27 DIAGNOSIS — Z961 Presence of intraocular lens: Secondary | ICD-10-CM | POA: Diagnosis not present

## 2022-10-27 DIAGNOSIS — Z947 Corneal transplant status: Secondary | ICD-10-CM | POA: Diagnosis not present

## 2022-10-27 DIAGNOSIS — L718 Other rosacea: Secondary | ICD-10-CM | POA: Diagnosis not present

## 2022-10-27 DIAGNOSIS — H401133 Primary open-angle glaucoma, bilateral, severe stage: Secondary | ICD-10-CM | POA: Diagnosis not present

## 2022-10-27 DIAGNOSIS — H0288A Meibomian gland dysfunction right eye, upper and lower eyelids: Secondary | ICD-10-CM | POA: Diagnosis not present

## 2022-10-30 DIAGNOSIS — H401133 Primary open-angle glaucoma, bilateral, severe stage: Secondary | ICD-10-CM | POA: Diagnosis not present

## 2022-10-30 DIAGNOSIS — H0288B Meibomian gland dysfunction left eye, upper and lower eyelids: Secondary | ICD-10-CM | POA: Diagnosis not present

## 2022-10-30 DIAGNOSIS — H0288A Meibomian gland dysfunction right eye, upper and lower eyelids: Secondary | ICD-10-CM | POA: Diagnosis not present

## 2022-10-30 DIAGNOSIS — L718 Other rosacea: Secondary | ICD-10-CM | POA: Diagnosis not present

## 2022-10-30 DIAGNOSIS — Z947 Corneal transplant status: Secondary | ICD-10-CM | POA: Diagnosis not present

## 2022-10-30 DIAGNOSIS — Z961 Presence of intraocular lens: Secondary | ICD-10-CM | POA: Diagnosis not present

## 2022-11-08 DIAGNOSIS — H0288B Meibomian gland dysfunction left eye, upper and lower eyelids: Secondary | ICD-10-CM | POA: Diagnosis not present

## 2022-11-08 DIAGNOSIS — L718 Other rosacea: Secondary | ICD-10-CM | POA: Diagnosis not present

## 2022-11-08 DIAGNOSIS — H0288A Meibomian gland dysfunction right eye, upper and lower eyelids: Secondary | ICD-10-CM | POA: Diagnosis not present

## 2022-11-08 DIAGNOSIS — H401133 Primary open-angle glaucoma, bilateral, severe stage: Secondary | ICD-10-CM | POA: Diagnosis not present

## 2022-11-08 DIAGNOSIS — Z947 Corneal transplant status: Secondary | ICD-10-CM | POA: Diagnosis not present

## 2022-11-08 DIAGNOSIS — Z961 Presence of intraocular lens: Secondary | ICD-10-CM | POA: Diagnosis not present

## 2022-11-30 DIAGNOSIS — I10 Essential (primary) hypertension: Secondary | ICD-10-CM | POA: Diagnosis not present

## 2022-11-30 DIAGNOSIS — I700 Atherosclerosis of aorta: Secondary | ICD-10-CM | POA: Diagnosis not present

## 2022-11-30 DIAGNOSIS — E7849 Other hyperlipidemia: Secondary | ICD-10-CM | POA: Diagnosis not present

## 2022-11-30 DIAGNOSIS — E1121 Type 2 diabetes mellitus with diabetic nephropathy: Secondary | ICD-10-CM | POA: Diagnosis not present

## 2022-11-30 DIAGNOSIS — N182 Chronic kidney disease, stage 2 (mild): Secondary | ICD-10-CM | POA: Diagnosis not present

## 2022-11-30 DIAGNOSIS — H409 Unspecified glaucoma: Secondary | ICD-10-CM | POA: Diagnosis not present

## 2022-12-21 DIAGNOSIS — H35373 Puckering of macula, bilateral: Secondary | ICD-10-CM | POA: Diagnosis not present

## 2022-12-21 DIAGNOSIS — H53143 Visual discomfort, bilateral: Secondary | ICD-10-CM | POA: Diagnosis not present

## 2022-12-21 DIAGNOSIS — H401133 Primary open-angle glaucoma, bilateral, severe stage: Secondary | ICD-10-CM | POA: Diagnosis not present

## 2022-12-21 DIAGNOSIS — L719 Rosacea, unspecified: Secondary | ICD-10-CM | POA: Diagnosis not present

## 2022-12-21 DIAGNOSIS — Z947 Corneal transplant status: Secondary | ICD-10-CM | POA: Diagnosis not present

## 2022-12-29 DIAGNOSIS — L718 Other rosacea: Secondary | ICD-10-CM | POA: Diagnosis not present

## 2022-12-29 DIAGNOSIS — H0288A Meibomian gland dysfunction right eye, upper and lower eyelids: Secondary | ICD-10-CM | POA: Diagnosis not present

## 2022-12-29 DIAGNOSIS — H0288B Meibomian gland dysfunction left eye, upper and lower eyelids: Secondary | ICD-10-CM | POA: Diagnosis not present

## 2022-12-29 DIAGNOSIS — H401133 Primary open-angle glaucoma, bilateral, severe stage: Secondary | ICD-10-CM | POA: Diagnosis not present

## 2022-12-29 DIAGNOSIS — Z947 Corneal transplant status: Secondary | ICD-10-CM | POA: Diagnosis not present

## 2022-12-29 DIAGNOSIS — Z961 Presence of intraocular lens: Secondary | ICD-10-CM | POA: Diagnosis not present

## 2023-01-31 DIAGNOSIS — Z947 Corneal transplant status: Secondary | ICD-10-CM | POA: Diagnosis not present

## 2023-01-31 DIAGNOSIS — H0288A Meibomian gland dysfunction right eye, upper and lower eyelids: Secondary | ICD-10-CM | POA: Diagnosis not present

## 2023-01-31 DIAGNOSIS — L718 Other rosacea: Secondary | ICD-10-CM | POA: Diagnosis not present

## 2023-01-31 DIAGNOSIS — H0288B Meibomian gland dysfunction left eye, upper and lower eyelids: Secondary | ICD-10-CM | POA: Diagnosis not present

## 2023-01-31 DIAGNOSIS — E119 Type 2 diabetes mellitus without complications: Secondary | ICD-10-CM | POA: Diagnosis not present

## 2023-01-31 DIAGNOSIS — H401133 Primary open-angle glaucoma, bilateral, severe stage: Secondary | ICD-10-CM | POA: Diagnosis not present

## 2023-01-31 DIAGNOSIS — Z961 Presence of intraocular lens: Secondary | ICD-10-CM | POA: Diagnosis not present

## 2023-03-26 DIAGNOSIS — L719 Rosacea, unspecified: Secondary | ICD-10-CM | POA: Diagnosis not present

## 2023-03-26 DIAGNOSIS — H401133 Primary open-angle glaucoma, bilateral, severe stage: Secondary | ICD-10-CM | POA: Diagnosis not present

## 2023-03-26 DIAGNOSIS — H35373 Puckering of macula, bilateral: Secondary | ICD-10-CM | POA: Diagnosis not present

## 2023-03-26 DIAGNOSIS — Z947 Corneal transplant status: Secondary | ICD-10-CM | POA: Diagnosis not present

## 2023-03-26 DIAGNOSIS — H53143 Visual discomfort, bilateral: Secondary | ICD-10-CM | POA: Diagnosis not present

## 2023-03-29 DIAGNOSIS — I700 Atherosclerosis of aorta: Secondary | ICD-10-CM | POA: Diagnosis not present

## 2023-03-29 DIAGNOSIS — E1121 Type 2 diabetes mellitus with diabetic nephropathy: Secondary | ICD-10-CM | POA: Diagnosis not present

## 2023-03-29 DIAGNOSIS — H409 Unspecified glaucoma: Secondary | ICD-10-CM | POA: Diagnosis not present

## 2023-03-29 DIAGNOSIS — E7849 Other hyperlipidemia: Secondary | ICD-10-CM | POA: Diagnosis not present

## 2023-03-29 DIAGNOSIS — N182 Chronic kidney disease, stage 2 (mild): Secondary | ICD-10-CM | POA: Diagnosis not present

## 2023-03-29 DIAGNOSIS — Z0001 Encounter for general adult medical examination with abnormal findings: Secondary | ICD-10-CM | POA: Diagnosis not present

## 2023-03-29 DIAGNOSIS — I10 Essential (primary) hypertension: Secondary | ICD-10-CM | POA: Diagnosis not present

## 2023-04-03 DIAGNOSIS — Z947 Corneal transplant status: Secondary | ICD-10-CM | POA: Diagnosis not present

## 2023-04-03 DIAGNOSIS — H35033 Hypertensive retinopathy, bilateral: Secondary | ICD-10-CM | POA: Diagnosis not present

## 2023-04-03 DIAGNOSIS — E119 Type 2 diabetes mellitus without complications: Secondary | ICD-10-CM | POA: Diagnosis not present

## 2023-04-03 DIAGNOSIS — Z961 Presence of intraocular lens: Secondary | ICD-10-CM | POA: Diagnosis not present

## 2023-04-03 DIAGNOSIS — H401133 Primary open-angle glaucoma, bilateral, severe stage: Secondary | ICD-10-CM | POA: Diagnosis not present

## 2023-04-18 DIAGNOSIS — I700 Atherosclerosis of aorta: Secondary | ICD-10-CM | POA: Diagnosis not present

## 2023-04-18 DIAGNOSIS — E7849 Other hyperlipidemia: Secondary | ICD-10-CM | POA: Diagnosis not present

## 2023-04-18 DIAGNOSIS — E1121 Type 2 diabetes mellitus with diabetic nephropathy: Secondary | ICD-10-CM | POA: Diagnosis not present

## 2023-04-18 DIAGNOSIS — I10 Essential (primary) hypertension: Secondary | ICD-10-CM | POA: Diagnosis not present

## 2023-04-18 DIAGNOSIS — N182 Chronic kidney disease, stage 2 (mild): Secondary | ICD-10-CM | POA: Diagnosis not present

## 2023-04-23 DIAGNOSIS — L570 Actinic keratosis: Secondary | ICD-10-CM | POA: Diagnosis not present

## 2023-05-02 DIAGNOSIS — Z961 Presence of intraocular lens: Secondary | ICD-10-CM | POA: Diagnosis not present

## 2023-05-02 DIAGNOSIS — Z947 Corneal transplant status: Secondary | ICD-10-CM | POA: Diagnosis not present

## 2023-05-02 DIAGNOSIS — H0288A Meibomian gland dysfunction right eye, upper and lower eyelids: Secondary | ICD-10-CM | POA: Diagnosis not present

## 2023-05-02 DIAGNOSIS — L718 Other rosacea: Secondary | ICD-10-CM | POA: Diagnosis not present

## 2023-05-02 DIAGNOSIS — E119 Type 2 diabetes mellitus without complications: Secondary | ICD-10-CM | POA: Diagnosis not present

## 2023-05-02 DIAGNOSIS — H401133 Primary open-angle glaucoma, bilateral, severe stage: Secondary | ICD-10-CM | POA: Diagnosis not present

## 2023-05-02 DIAGNOSIS — H0288B Meibomian gland dysfunction left eye, upper and lower eyelids: Secondary | ICD-10-CM | POA: Diagnosis not present

## 2023-05-23 DIAGNOSIS — R809 Proteinuria, unspecified: Secondary | ICD-10-CM | POA: Diagnosis not present

## 2023-05-23 DIAGNOSIS — E7849 Other hyperlipidemia: Secondary | ICD-10-CM | POA: Diagnosis not present

## 2023-05-23 DIAGNOSIS — E1121 Type 2 diabetes mellitus with diabetic nephropathy: Secondary | ICD-10-CM | POA: Diagnosis not present

## 2023-05-23 DIAGNOSIS — I10 Essential (primary) hypertension: Secondary | ICD-10-CM | POA: Diagnosis not present

## 2023-06-13 DIAGNOSIS — E119 Type 2 diabetes mellitus without complications: Secondary | ICD-10-CM | POA: Diagnosis not present

## 2023-06-13 DIAGNOSIS — H401133 Primary open-angle glaucoma, bilateral, severe stage: Secondary | ICD-10-CM | POA: Diagnosis not present

## 2023-06-13 DIAGNOSIS — Z961 Presence of intraocular lens: Secondary | ICD-10-CM | POA: Diagnosis not present

## 2023-06-13 DIAGNOSIS — H0288B Meibomian gland dysfunction left eye, upper and lower eyelids: Secondary | ICD-10-CM | POA: Diagnosis not present

## 2023-06-13 DIAGNOSIS — H0288A Meibomian gland dysfunction right eye, upper and lower eyelids: Secondary | ICD-10-CM | POA: Diagnosis not present

## 2023-06-13 DIAGNOSIS — L718 Other rosacea: Secondary | ICD-10-CM | POA: Diagnosis not present

## 2023-06-13 DIAGNOSIS — Z947 Corneal transplant status: Secondary | ICD-10-CM | POA: Diagnosis not present

## 2023-06-25 DIAGNOSIS — E7849 Other hyperlipidemia: Secondary | ICD-10-CM | POA: Diagnosis not present

## 2023-06-25 DIAGNOSIS — I10 Essential (primary) hypertension: Secondary | ICD-10-CM | POA: Diagnosis not present

## 2023-06-25 DIAGNOSIS — E1121 Type 2 diabetes mellitus with diabetic nephropathy: Secondary | ICD-10-CM | POA: Diagnosis not present

## 2023-07-17 DIAGNOSIS — H35373 Puckering of macula, bilateral: Secondary | ICD-10-CM | POA: Diagnosis not present

## 2023-07-17 DIAGNOSIS — H53143 Visual discomfort, bilateral: Secondary | ICD-10-CM | POA: Diagnosis not present

## 2023-07-17 DIAGNOSIS — H401133 Primary open-angle glaucoma, bilateral, severe stage: Secondary | ICD-10-CM | POA: Diagnosis not present

## 2023-07-17 DIAGNOSIS — L719 Rosacea, unspecified: Secondary | ICD-10-CM | POA: Diagnosis not present

## 2023-07-17 DIAGNOSIS — Z947 Corneal transplant status: Secondary | ICD-10-CM | POA: Diagnosis not present

## 2023-08-20 DIAGNOSIS — N182 Chronic kidney disease, stage 2 (mild): Secondary | ICD-10-CM | POA: Diagnosis not present

## 2023-08-20 DIAGNOSIS — I10 Essential (primary) hypertension: Secondary | ICD-10-CM | POA: Diagnosis not present

## 2023-08-20 DIAGNOSIS — I700 Atherosclerosis of aorta: Secondary | ICD-10-CM | POA: Diagnosis not present

## 2023-08-20 DIAGNOSIS — H409 Unspecified glaucoma: Secondary | ICD-10-CM | POA: Diagnosis not present

## 2023-08-20 DIAGNOSIS — E7849 Other hyperlipidemia: Secondary | ICD-10-CM | POA: Diagnosis not present

## 2023-08-20 DIAGNOSIS — Z0000 Encounter for general adult medical examination without abnormal findings: Secondary | ICD-10-CM | POA: Diagnosis not present

## 2023-08-20 DIAGNOSIS — E1122 Type 2 diabetes mellitus with diabetic chronic kidney disease: Secondary | ICD-10-CM | POA: Diagnosis not present

## 2023-08-28 DIAGNOSIS — H401133 Primary open-angle glaucoma, bilateral, severe stage: Secondary | ICD-10-CM | POA: Diagnosis not present

## 2023-08-28 DIAGNOSIS — H53143 Visual discomfort, bilateral: Secondary | ICD-10-CM | POA: Diagnosis not present

## 2023-08-28 DIAGNOSIS — Z947 Corneal transplant status: Secondary | ICD-10-CM | POA: Diagnosis not present

## 2023-08-28 DIAGNOSIS — L719 Rosacea, unspecified: Secondary | ICD-10-CM | POA: Diagnosis not present

## 2023-08-28 DIAGNOSIS — H35373 Puckering of macula, bilateral: Secondary | ICD-10-CM | POA: Diagnosis not present

## 2023-09-12 DIAGNOSIS — Z961 Presence of intraocular lens: Secondary | ICD-10-CM | POA: Diagnosis not present

## 2023-09-12 DIAGNOSIS — H0288A Meibomian gland dysfunction right eye, upper and lower eyelids: Secondary | ICD-10-CM | POA: Diagnosis not present

## 2023-09-12 DIAGNOSIS — H401133 Primary open-angle glaucoma, bilateral, severe stage: Secondary | ICD-10-CM | POA: Diagnosis not present

## 2023-09-12 DIAGNOSIS — Z947 Corneal transplant status: Secondary | ICD-10-CM | POA: Diagnosis not present

## 2023-09-12 DIAGNOSIS — E119 Type 2 diabetes mellitus without complications: Secondary | ICD-10-CM | POA: Diagnosis not present

## 2023-09-12 DIAGNOSIS — L718 Other rosacea: Secondary | ICD-10-CM | POA: Diagnosis not present

## 2023-09-12 DIAGNOSIS — H0288B Meibomian gland dysfunction left eye, upper and lower eyelids: Secondary | ICD-10-CM | POA: Diagnosis not present

## 2023-10-16 DIAGNOSIS — L821 Other seborrheic keratosis: Secondary | ICD-10-CM | POA: Diagnosis not present

## 2023-10-16 DIAGNOSIS — C44619 Basal cell carcinoma of skin of left upper limb, including shoulder: Secondary | ICD-10-CM | POA: Diagnosis not present

## 2023-10-16 DIAGNOSIS — L570 Actinic keratosis: Secondary | ICD-10-CM | POA: Diagnosis not present

## 2023-10-26 DIAGNOSIS — H401134 Primary open-angle glaucoma, bilateral, indeterminate stage: Secondary | ICD-10-CM | POA: Diagnosis not present

## 2023-10-26 DIAGNOSIS — Z947 Corneal transplant status: Secondary | ICD-10-CM | POA: Diagnosis not present

## 2023-10-26 DIAGNOSIS — H04122 Dry eye syndrome of left lacrimal gland: Secondary | ICD-10-CM | POA: Diagnosis not present

## 2023-10-26 DIAGNOSIS — H04121 Dry eye syndrome of right lacrimal gland: Secondary | ICD-10-CM | POA: Diagnosis not present

## 2023-10-26 DIAGNOSIS — H04123 Dry eye syndrome of bilateral lacrimal glands: Secondary | ICD-10-CM | POA: Diagnosis not present

## 2023-10-29 DIAGNOSIS — E1122 Type 2 diabetes mellitus with diabetic chronic kidney disease: Secondary | ICD-10-CM | POA: Diagnosis not present

## 2023-10-29 DIAGNOSIS — N182 Chronic kidney disease, stage 2 (mild): Secondary | ICD-10-CM | POA: Diagnosis not present

## 2023-10-30 DIAGNOSIS — H35373 Puckering of macula, bilateral: Secondary | ICD-10-CM | POA: Diagnosis not present

## 2023-10-30 DIAGNOSIS — Z947 Corneal transplant status: Secondary | ICD-10-CM | POA: Diagnosis not present

## 2023-10-30 DIAGNOSIS — H53143 Visual discomfort, bilateral: Secondary | ICD-10-CM | POA: Diagnosis not present

## 2023-10-30 DIAGNOSIS — L719 Rosacea, unspecified: Secondary | ICD-10-CM | POA: Diagnosis not present

## 2023-10-30 DIAGNOSIS — H401133 Primary open-angle glaucoma, bilateral, severe stage: Secondary | ICD-10-CM | POA: Diagnosis not present

## 2023-11-01 DIAGNOSIS — L905 Scar conditions and fibrosis of skin: Secondary | ICD-10-CM | POA: Diagnosis not present

## 2023-11-01 DIAGNOSIS — C44619 Basal cell carcinoma of skin of left upper limb, including shoulder: Secondary | ICD-10-CM | POA: Diagnosis not present

## 2023-11-09 DIAGNOSIS — Z961 Presence of intraocular lens: Secondary | ICD-10-CM | POA: Diagnosis not present

## 2023-11-09 DIAGNOSIS — H0288A Meibomian gland dysfunction right eye, upper and lower eyelids: Secondary | ICD-10-CM | POA: Diagnosis not present

## 2023-11-09 DIAGNOSIS — Z947 Corneal transplant status: Secondary | ICD-10-CM | POA: Diagnosis not present

## 2023-11-09 DIAGNOSIS — H401133 Primary open-angle glaucoma, bilateral, severe stage: Secondary | ICD-10-CM | POA: Diagnosis not present

## 2023-11-09 DIAGNOSIS — E119 Type 2 diabetes mellitus without complications: Secondary | ICD-10-CM | POA: Diagnosis not present

## 2023-11-09 DIAGNOSIS — L718 Other rosacea: Secondary | ICD-10-CM | POA: Diagnosis not present

## 2023-11-09 DIAGNOSIS — G245 Blepharospasm: Secondary | ICD-10-CM | POA: Diagnosis not present

## 2023-11-09 DIAGNOSIS — H0288B Meibomian gland dysfunction left eye, upper and lower eyelids: Secondary | ICD-10-CM | POA: Diagnosis not present

## 2023-12-05 DIAGNOSIS — H81399 Other peripheral vertigo, unspecified ear: Secondary | ICD-10-CM | POA: Diagnosis not present

## 2023-12-19 DIAGNOSIS — H401133 Primary open-angle glaucoma, bilateral, severe stage: Secondary | ICD-10-CM | POA: Diagnosis not present

## 2023-12-19 DIAGNOSIS — H0288B Meibomian gland dysfunction left eye, upper and lower eyelids: Secondary | ICD-10-CM | POA: Diagnosis not present

## 2023-12-19 DIAGNOSIS — E119 Type 2 diabetes mellitus without complications: Secondary | ICD-10-CM | POA: Diagnosis not present

## 2023-12-19 DIAGNOSIS — G245 Blepharospasm: Secondary | ICD-10-CM | POA: Diagnosis not present

## 2023-12-19 DIAGNOSIS — L718 Other rosacea: Secondary | ICD-10-CM | POA: Diagnosis not present

## 2023-12-19 DIAGNOSIS — Z947 Corneal transplant status: Secondary | ICD-10-CM | POA: Diagnosis not present

## 2023-12-19 DIAGNOSIS — Z961 Presence of intraocular lens: Secondary | ICD-10-CM | POA: Diagnosis not present

## 2023-12-19 DIAGNOSIS — H0288A Meibomian gland dysfunction right eye, upper and lower eyelids: Secondary | ICD-10-CM | POA: Diagnosis not present

## 2023-12-20 DIAGNOSIS — H81399 Other peripheral vertigo, unspecified ear: Secondary | ICD-10-CM | POA: Diagnosis not present

## 2023-12-20 DIAGNOSIS — I480 Paroxysmal atrial fibrillation: Secondary | ICD-10-CM | POA: Diagnosis not present

## 2023-12-20 DIAGNOSIS — H4089 Other specified glaucoma: Secondary | ICD-10-CM | POA: Diagnosis not present

## 2023-12-20 DIAGNOSIS — E1122 Type 2 diabetes mellitus with diabetic chronic kidney disease: Secondary | ICD-10-CM | POA: Diagnosis not present

## 2023-12-20 DIAGNOSIS — I10 Essential (primary) hypertension: Secondary | ICD-10-CM | POA: Diagnosis not present

## 2023-12-20 DIAGNOSIS — Z0000 Encounter for general adult medical examination without abnormal findings: Secondary | ICD-10-CM | POA: Diagnosis not present

## 2023-12-20 DIAGNOSIS — E038 Other specified hypothyroidism: Secondary | ICD-10-CM | POA: Diagnosis not present

## 2023-12-20 DIAGNOSIS — N182 Chronic kidney disease, stage 2 (mild): Secondary | ICD-10-CM | POA: Diagnosis not present

## 2023-12-27 DIAGNOSIS — H53143 Visual discomfort, bilateral: Secondary | ICD-10-CM | POA: Diagnosis not present

## 2023-12-27 DIAGNOSIS — Z947 Corneal transplant status: Secondary | ICD-10-CM | POA: Diagnosis not present

## 2023-12-28 DIAGNOSIS — N182 Chronic kidney disease, stage 2 (mild): Secondary | ICD-10-CM | POA: Diagnosis not present

## 2023-12-28 DIAGNOSIS — E1122 Type 2 diabetes mellitus with diabetic chronic kidney disease: Secondary | ICD-10-CM | POA: Diagnosis not present

## 2024-01-21 DIAGNOSIS — H401133 Primary open-angle glaucoma, bilateral, severe stage: Secondary | ICD-10-CM | POA: Diagnosis not present

## 2024-01-21 DIAGNOSIS — H35373 Puckering of macula, bilateral: Secondary | ICD-10-CM | POA: Diagnosis not present

## 2024-01-21 DIAGNOSIS — Z947 Corneal transplant status: Secondary | ICD-10-CM | POA: Diagnosis not present

## 2024-01-21 DIAGNOSIS — L719 Rosacea, unspecified: Secondary | ICD-10-CM | POA: Diagnosis not present

## 2024-01-21 DIAGNOSIS — H53143 Visual discomfort, bilateral: Secondary | ICD-10-CM | POA: Diagnosis not present

## 2024-01-25 DIAGNOSIS — H0288A Meibomian gland dysfunction right eye, upper and lower eyelids: Secondary | ICD-10-CM | POA: Diagnosis not present

## 2024-01-25 DIAGNOSIS — E119 Type 2 diabetes mellitus without complications: Secondary | ICD-10-CM | POA: Diagnosis not present

## 2024-01-25 DIAGNOSIS — H0288B Meibomian gland dysfunction left eye, upper and lower eyelids: Secondary | ICD-10-CM | POA: Diagnosis not present

## 2024-01-25 DIAGNOSIS — Z947 Corneal transplant status: Secondary | ICD-10-CM | POA: Diagnosis not present

## 2024-01-25 DIAGNOSIS — H401133 Primary open-angle glaucoma, bilateral, severe stage: Secondary | ICD-10-CM | POA: Diagnosis not present

## 2024-01-25 DIAGNOSIS — G245 Blepharospasm: Secondary | ICD-10-CM | POA: Diagnosis not present

## 2024-01-25 DIAGNOSIS — L718 Other rosacea: Secondary | ICD-10-CM | POA: Diagnosis not present

## 2024-01-25 DIAGNOSIS — Z961 Presence of intraocular lens: Secondary | ICD-10-CM | POA: Diagnosis not present

## 2024-02-13 DIAGNOSIS — H0288B Meibomian gland dysfunction left eye, upper and lower eyelids: Secondary | ICD-10-CM | POA: Diagnosis not present

## 2024-02-13 DIAGNOSIS — G245 Blepharospasm: Secondary | ICD-10-CM | POA: Diagnosis not present

## 2024-02-13 DIAGNOSIS — L718 Other rosacea: Secondary | ICD-10-CM | POA: Diagnosis not present

## 2024-02-13 DIAGNOSIS — Z947 Corneal transplant status: Secondary | ICD-10-CM | POA: Diagnosis not present

## 2024-02-13 DIAGNOSIS — H0288A Meibomian gland dysfunction right eye, upper and lower eyelids: Secondary | ICD-10-CM | POA: Diagnosis not present

## 2024-02-13 DIAGNOSIS — H401133 Primary open-angle glaucoma, bilateral, severe stage: Secondary | ICD-10-CM | POA: Diagnosis not present

## 2024-02-13 DIAGNOSIS — Z961 Presence of intraocular lens: Secondary | ICD-10-CM | POA: Diagnosis not present

## 2024-02-13 DIAGNOSIS — E119 Type 2 diabetes mellitus without complications: Secondary | ICD-10-CM | POA: Diagnosis not present

## 2024-02-20 DIAGNOSIS — N182 Chronic kidney disease, stage 2 (mild): Secondary | ICD-10-CM | POA: Diagnosis not present

## 2024-02-20 DIAGNOSIS — E1122 Type 2 diabetes mellitus with diabetic chronic kidney disease: Secondary | ICD-10-CM | POA: Diagnosis not present

## 2024-02-26 DIAGNOSIS — Z13820 Encounter for screening for osteoporosis: Secondary | ICD-10-CM | POA: Diagnosis not present

## 2024-02-26 DIAGNOSIS — M810 Age-related osteoporosis without current pathological fracture: Secondary | ICD-10-CM | POA: Diagnosis not present

## 2024-03-05 DIAGNOSIS — Z947 Corneal transplant status: Secondary | ICD-10-CM | POA: Diagnosis not present

## 2024-03-05 DIAGNOSIS — G245 Blepharospasm: Secondary | ICD-10-CM | POA: Diagnosis not present

## 2024-03-05 DIAGNOSIS — L718 Other rosacea: Secondary | ICD-10-CM | POA: Diagnosis not present

## 2024-03-05 DIAGNOSIS — E119 Type 2 diabetes mellitus without complications: Secondary | ICD-10-CM | POA: Diagnosis not present

## 2024-03-05 DIAGNOSIS — H0288A Meibomian gland dysfunction right eye, upper and lower eyelids: Secondary | ICD-10-CM | POA: Diagnosis not present

## 2024-03-05 DIAGNOSIS — H0288B Meibomian gland dysfunction left eye, upper and lower eyelids: Secondary | ICD-10-CM | POA: Diagnosis not present

## 2024-03-05 DIAGNOSIS — H401133 Primary open-angle glaucoma, bilateral, severe stage: Secondary | ICD-10-CM | POA: Diagnosis not present

## 2024-03-05 DIAGNOSIS — Z961 Presence of intraocular lens: Secondary | ICD-10-CM | POA: Diagnosis not present

## 2024-04-02 DIAGNOSIS — L570 Actinic keratosis: Secondary | ICD-10-CM | POA: Diagnosis not present

## 2024-04-03 DIAGNOSIS — H4089 Other specified glaucoma: Secondary | ICD-10-CM | POA: Diagnosis not present

## 2024-04-03 DIAGNOSIS — I10 Essential (primary) hypertension: Secondary | ICD-10-CM | POA: Diagnosis not present

## 2024-04-03 DIAGNOSIS — H81399 Other peripheral vertigo, unspecified ear: Secondary | ICD-10-CM | POA: Diagnosis not present

## 2024-04-03 DIAGNOSIS — E1122 Type 2 diabetes mellitus with diabetic chronic kidney disease: Secondary | ICD-10-CM | POA: Diagnosis not present

## 2024-04-03 DIAGNOSIS — I480 Paroxysmal atrial fibrillation: Secondary | ICD-10-CM | POA: Diagnosis not present

## 2024-04-03 DIAGNOSIS — E038 Other specified hypothyroidism: Secondary | ICD-10-CM | POA: Diagnosis not present

## 2024-04-03 DIAGNOSIS — N182 Chronic kidney disease, stage 2 (mild): Secondary | ICD-10-CM | POA: Diagnosis not present

## 2024-04-07 NOTE — Progress Notes (Signed)
 [place] FANT                                          MRN: [id number]   04/07/2024   The [facility] Quality Team Specialist reviewed this patient medical record for the purposes of chart review for care gap closure. The following were reviewed: chart review for care gap closure-kidney health evaluation for diabetes:uACR.    VBCI Quality Team

## 2024-06-09 ENCOUNTER — Other Ambulatory Visit: Payer: Self-pay

## 2024-06-09 ENCOUNTER — Encounter (HOSPITAL_COMMUNITY): Payer: Self-pay

## 2024-06-09 ENCOUNTER — Emergency Department (HOSPITAL_COMMUNITY)

## 2024-06-09 ENCOUNTER — Emergency Department (HOSPITAL_COMMUNITY)
Admission: EM | Admit: 2024-06-09 | Discharge: 2024-06-09 | Disposition: A | Discharge status: Home / Self Care | Attending: Emergency Medicine | Admitting: Emergency Medicine

## 2024-06-09 DIAGNOSIS — U071 COVID-19: Secondary | ICD-10-CM

## 2024-06-09 LAB — URINALYSIS, ROUTINE W REFLEX MICROSCOPIC
Bacteria, UA: NONE SEEN
Bilirubin Urine: NEGATIVE
Glucose, UA: >=500 mg/dL — AB
Ketones, ur: 5 mg/dL — AB
Leukocytes,Ua: NEGATIVE
Nitrite: NEGATIVE
Protein, ur: 100 mg/dL — AB
Specific Gravity, Urine: 1.021 (ref 1.005–1.030)
pH: 5 (ref 5.0–8.0)

## 2024-06-09 LAB — RESP PANEL BY RT-PCR (RSV, FLU A&B, COVID)  RVPGX2
Influenza A by PCR: NEGATIVE
Influenza B by PCR: NEGATIVE
Resp Syncytial Virus by PCR: NEGATIVE
SARS Coronavirus 2 by RT PCR: POSITIVE — AB

## 2024-06-09 LAB — COMPREHENSIVE METABOLIC PANEL WITH GFR
ALT: 20 U/L (ref 0–44)
AST: 23 U/L (ref 15–41)
Albumin: 4.6 g/dL (ref 3.5–5.0)
Alkaline Phosphatase: 53 U/L (ref 38–126)
Anion gap: 15 (ref 5–15)
BUN: 20 mg/dL (ref 8–23)
CO2: 20 mmol/L — ABNORMAL LOW (ref 22–32)
Calcium: 9.2 mg/dL (ref 8.9–10.3)
Chloride: 97 mmol/L — ABNORMAL LOW (ref 98–111)
Creatinine, Ser: 1.16 mg/dL (ref 0.61–1.24)
GFR, Estimated: >60 mL/min (ref >60)
Glucose, Bld: 245 mg/dL — ABNORMAL HIGH (ref 70–99)
Potassium: 4.4 mmol/L (ref 3.5–5.1)
Sodium: 131 mmol/L — ABNORMAL LOW (ref 135–145)
Total Bilirubin: 0.9 mg/dL (ref 0.0–1.2)
Total Protein: 8 g/dL (ref 6.5–8.1)

## 2024-06-09 LAB — CBG MONITORING, ED: Glucose-Capillary: 221 mg/dL — ABNORMAL HIGH (ref 70–99)

## 2024-06-09 LAB — GROUP A STREP BY PCR: Group A Strep by PCR: NOT DETECTED

## 2024-06-09 MED ORDER — PAXLOVID (300/100) 20 X 150 MG & 10 X 100MG PO TBPK: 3.0000 | ORAL_TABLET | Freq: Two times a day (BID) | ORAL | 0 refills | Status: AC

## 2024-06-09 MED ORDER — ACETAMINOPHEN 325 MG PO TABS
650.0000 mg | ORAL_TABLET | Freq: Once | ORAL | Status: AC
  Administered 2024-06-09: 650 mg via ORAL
  Filled 2024-06-09: qty 2

## 2024-06-09 MED ORDER — PAXLOVID (300/100) 20 X 150 MG & 10 X 100MG PO TBPK: 3.0000 | ORAL_TABLET | Freq: Two times a day (BID) | ORAL | 0 refills | Status: DC

## 2024-06-09 NOTE — ED Provider Notes (Signed)
 [place] EMERGENCY DEPARTMENT AT [facility] Provider Note   CSN: [id number] Arrival date & time: 06/09/24  9182     Patient presents with: Fever and Cough   Micheal Young is a 73 y.o. male with a history including type 2 diabetes, hypertension, hypothyroidism presenting with a 4-day history of flulike symptoms including general myalgias, sore throat intermittent fevers and chills with a Tmax of 101 which responds to Tylenol , his last dose was taken yesterday evening.  He also endorses a cough which has been occasionally productive of clear sputum, the cough is improving in comparison to date of onset of symptoms.  He denies chest pain, shortness of breath, nausea vomiting or abdominal pain although he does report having reduced appetite.  He has been increasing his fluid intake which helps resolve a feeling of hunger, and does endorse increased urinary frequency but denies dysuria.  No back pain, no diarrhea.  He started taking Delsym yesterday for cough.  He did not receive a flu vaccine this year.   The history is provided by the patient and the spouse.       Prior to Admission medications   Medication Sig Start Date End Date Taking? Authorizing Provider  aspirin  EC 81 MG tablet Take 1 tablet (81 mg total) by mouth daily. 12/01/13   Debera Jayson MATSU, MD  dorzolamide-timolol (COSOPT) 22.3-6.8 MG/ML ophthalmic solution Place 1 drop into both eyes 2 (two) times daily. 09/21/20   [provider]  Latanoprost 0.005 % EMUL Place 1 drop into both eyes at bedtime.    [provider]  levothyroxine (SYNTHROID) 100 MCG tablet Take 100 mcg by mouth daily before breakfast.    [provider]  metFORMIN (GLUCOPHAGE-XR) 500 MG 24 hr tablet Take 500 mg by mouth 2 (two) times daily. 07/09/13   [provider]  metoprolol succinate (TOPROL-XL) 25 MG 24 hr tablet Take 25 mg by mouth daily. 10/06/13   [provider]  nirmatrelvir/ritonavir (PAXLOVID ,  300/100,) 20 x 150 MG & 10 x 100MG  TBPK Take 3 tablets by mouth 2 (two) times daily for 5 days. Patient GFR is >60. Take nirmatrelvir (150 mg) two tablets twice daily for 5 days and ritonavir (100 mg) one tablet twice daily for 5 days. 06/09/24 06/14/24  Madalene Mickler, PA-C  olmesartan (BENICAR) 20 MG tablet Take 20 mg by mouth daily. 07/13/20   [provider]  Omega-3 Fatty Acids (FISH OIL PO) Take 1 capsule by mouth 2 (two) times daily.    [provider]    Allergies: Shellfish allergy    Review of Systems  Updated Vital Signs BP 121/63   Pulse 71   Temp 98.7 F (37.1 C) (Oral)   Resp 17   Ht 5' 8 (1.727 m)   Wt 80.7 kg   SpO2 94%   BMI 27.06 kg/m   Physical Exam Vitals and nursing note reviewed.  Constitutional:      Appearance: He is well-developed.  HENT:     Head: Normocephalic and atraumatic.  Eyes:     Conjunctiva/sclera: Conjunctivae normal.  Cardiovascular:     Rate and Rhythm: Regular rhythm.     Heart sounds: Normal heart sounds.  Pulmonary:     Effort: Pulmonary effort is normal.     Breath sounds: Normal breath sounds. No wheezing.  Abdominal:     General: Bowel sounds are normal.     Palpations: Abdomen is soft.     Tenderness: There is no abdominal  tenderness.  Musculoskeletal:        General: Normal range of motion.     Cervical back: Normal range of motion.  Skin:    General: Skin is warm and dry.  Neurological:     Mental Status: He is alert.     (all labs ordered are listed, but only abnormal results are displayed) Labs Reviewed  RESP PANEL BY RT-PCR (RSV, FLU A&B, COVID)  RVPGX2 - Abnormal; Notable for the following components:      Result Value   SARS Coronavirus 2 by RT PCR POSITIVE (*)    All other components within normal limits  URINALYSIS, ROUTINE W REFLEX MICROSCOPIC - Abnormal; Notable for the following components:   Glucose, UA >=500 (*)    Hgb urine dipstick SMALL (*)    Ketones, ur 5 (*)    Protein, ur 100 (*)     All other components within normal limits  COMPREHENSIVE METABOLIC PANEL WITH GFR - Abnormal; Notable for the following components:   Sodium 131 (*)    Chloride 97 (*)    CO2 20 (*)    Glucose, Bld 245 (*)    All other components within normal limits  CBG MONITORING, ED - Abnormal; Notable for the following components:   Glucose-Capillary 221 (*)    All other components within normal limits  GROUP A STREP BY PCR    EKG: None  Radiology: DG Chest Portable 1 View Result Date: 06/09/2024 EXAM: 1 VIEW(S) XRAY OF THE CHEST 06/09/2024 09:53:28 AM COMPARISON: 10/05/2013. CLINICAL HISTORY: cough FINDINGS: LUNGS AND PLEURA: Suboptimal evaluation of the retrocardiac left lower lobe, secondary to apical lordotic AP portable radiograph. Otherwise, no airspace disease identified in the evaluated regions. No pleural effusion. No pneumothorax. HEART AND MEDIASTINUM: Moderate cardiomegaly. Tortuous descending thoracic aorta. No congestive heart failure. BONES AND SOFT TISSUES: Thoracic degenerative changes. No acute osseous abnormality. IMPRESSION: 1. Cardiomegaly, without explanation for cough. 2. Suboptimal evaluation of the medial left lung base secondary to AP portable technique and positioning. If concern for left lower lobe pneumonia, recommend dedicated PA and lateral radiographs. Electronically signed by: Rockey Kilts MD 06/09/2024 10:43 AM EST RP Workstation: [id number]     Procedures   Medications Ordered in the ED  acetaminophen  (TYLENOL ) tablet 650 mg (650 mg Oral Given 06/09/24 0910)                                    Medical Decision Making Patient presenting with flulike symptoms, no shortness of breath, no chest pain, no obvious exposures.  Differential diagnosis including viral URI, influenza, COVID, pneumonia versus other viral or bacterial respiratory infection.  Imaging and labs have been obtained confirming this is COVID-19.  He does have increased risks including age and  diabetes, therefore he is being started on Paxlovid .  Labs have been obtained, he has no liver or renal insufficiency.  We discussed home care along with strict return precautions.  He was ambulated in the department and had no desaturation.  Amount and/or Complexity of Data Reviewed Labs: ordered.    Details: Labs are positive for COVID-19, negative for influenza, strep is negative, his CMET revealing a glucose of 245, no anion gap, urinalysis negative for infection. Radiology: ordered and independent interpretation performed.    Details: Portable chest x-ray was suboptimal, recommended 2 view to rule out pneumonia.  While awaiting two-view reading, patient declined waiting further, concern over increment weather.  No obvious infiltrates on this two-view imaging.  Risk OTC drugs. Prescription drug management.        Final diagnoses:  COVID-19    ED Discharge Orders          Ordered    nirmatrelvir/ritonavir (PAXLOVID , 300/100,) 20 x 150 MG & 10 x 100MG  TBPK  2 times daily,   Status:  Discontinued        06/09/24 1051    nirmatrelvir/ritonavir (PAXLOVID , 300/100,) 20 x 150 MG & 10 x 100MG  TBPK  2 times daily        06/09/24 1052               Birdena Clarity, PA-C 06/09/24 1215    Towana Ozell BROCKS, MD 06/09/24 1740

## 2024-06-09 NOTE — ED Notes (Addendum)
 SpO2 remained at 96% the whole time pt was ambulating Dr and RN notified

## 2024-06-09 NOTE — ED Notes (Signed)
Patient is aware a urine specimen is needed.  

## 2024-06-09 NOTE — Discharge Instructions (Signed)
 Rest make sure you are drinking plenty of fluids.  Take the entire course of the antiviral medication prescribed, this should help reduce the length and severity of your symptoms.  However, get rechecked immediately if you develop any increasing weakness, shortness of breath or any new or worsening symptoms.

## 2024-06-09 NOTE — ED Triage Notes (Signed)
 Pt came in POV with complaints of chills, body aches, cough, and fever. Pt states that it started Friday night.
